# Patient Record
Sex: Female | Born: 2000
Health system: Southern US, Community
[De-identification: ages and names within clinical notes are randomized; demographics above are authoritative.]

## PROBLEM LIST (undated history)

## (undated) DIAGNOSIS — Z8669 Personal history of other diseases of the nervous system and sense organs: Secondary | ICD-10-CM

## (undated) HISTORY — PX: HERNIA REPAIR: SHX51

---
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
## 2020-05-25 ENCOUNTER — Encounter (HOSPITAL_BASED_OUTPATIENT_CLINIC_OR_DEPARTMENT_OTHER): Payer: Self-pay | Admitting: *Deleted

## 2020-05-25 ENCOUNTER — Emergency Department (INDEPENDENT_AMBULATORY_CARE_PROVIDER_SITE_OTHER)
Admission: EM | Admit: 2020-05-25 | Discharge: 2020-05-25 | Disposition: A | Discharge status: Home / Self Care | Payer: 59 | Source: Home / Self Care

## 2020-05-25 ENCOUNTER — Emergency Department (HOSPITAL_BASED_OUTPATIENT_CLINIC_OR_DEPARTMENT_OTHER): Payer: 59

## 2020-05-25 ENCOUNTER — Other Ambulatory Visit: Payer: Self-pay

## 2020-05-25 ENCOUNTER — Emergency Department (HOSPITAL_BASED_OUTPATIENT_CLINIC_OR_DEPARTMENT_OTHER)
Admission: EM | Admit: 2020-05-25 | Discharge: 2020-05-25 | Disposition: A | Discharge status: Home / Self Care | Payer: 59 | Attending: Emergency Medicine | Admitting: Emergency Medicine

## 2020-05-25 ENCOUNTER — Encounter: Payer: Self-pay | Admitting: Emergency Medicine

## 2020-05-25 DIAGNOSIS — R1013 Epigastric pain: Secondary | ICD-10-CM | POA: Diagnosis not present

## 2020-05-25 DIAGNOSIS — R630 Anorexia: Secondary | ICD-10-CM

## 2020-05-25 DIAGNOSIS — R112 Nausea with vomiting, unspecified: Secondary | ICD-10-CM | POA: Diagnosis not present

## 2020-05-25 DIAGNOSIS — F17290 Nicotine dependence, other tobacco product, uncomplicated: Secondary | ICD-10-CM | POA: Diagnosis not present

## 2020-05-25 DIAGNOSIS — R1010 Upper abdominal pain, unspecified: Secondary | ICD-10-CM | POA: Insufficient documentation

## 2020-05-25 DIAGNOSIS — R1084 Generalized abdominal pain: Secondary | ICD-10-CM | POA: Diagnosis not present

## 2020-05-25 DIAGNOSIS — R110 Nausea: Secondary | ICD-10-CM

## 2020-05-25 HISTORY — DX: Personal history of other diseases of the nervous system and sense organs: Z86.69

## 2020-05-25 LAB — COMPREHENSIVE METABOLIC PANEL
ALT: 14 U/L (ref 0–44)
AST: 14 U/L — ABNORMAL LOW (ref 15–41)
Albumin: 4.1 g/dL (ref 3.5–5.0)
Alkaline Phosphatase: 50 U/L (ref 38–126)
Anion gap: 9 (ref 5–15)
BUN: 11 mg/dL (ref 6–20)
CO2: 22 mmol/L (ref 22–32)
Calcium: 8.7 mg/dL — ABNORMAL LOW (ref 8.9–10.3)
Chloride: 106 mmol/L (ref 98–111)
Creatinine, Ser: 0.54 mg/dL (ref 0.44–1.00)
GFR, Estimated: >60 mL/min (ref >60)
Glucose, Bld: 85 mg/dL (ref 70–99)
Potassium: 3.8 mmol/L (ref 3.5–5.1)
Sodium: 137 mmol/L (ref 135–145)
Total Bilirubin: 0.7 mg/dL (ref 0.3–1.2)
Total Protein: 7 g/dL (ref 6.5–8.1)

## 2020-05-25 LAB — CBC WITH DIFFERENTIAL/PLATELET
Abs Immature Granulocytes: 0.03 10*3/uL (ref 0.00–0.07)
Basophils Absolute: 0 10*3/uL (ref 0.0–0.1)
Basophils Relative: 1 %
Eosinophils Absolute: 0.2 10*3/uL (ref 0.0–0.5)
Eosinophils Relative: 3 %
HCT: 39.4 % (ref 36.0–46.0)
Hemoglobin: 13.2 g/dL (ref 12.0–15.0)
Immature Granulocytes: 0 %
Lymphocytes Relative: 23 %
Lymphs Abs: 1.9 10*3/uL (ref 0.7–4.0)
MCH: 29.9 pg (ref 26.0–34.0)
MCHC: 33.5 g/dL (ref 30.0–36.0)
MCV: 89.1 fL (ref 80.0–100.0)
Monocytes Absolute: 0.6 10*3/uL (ref 0.1–1.0)
Monocytes Relative: 8 %
Neutro Abs: 5.3 10*3/uL (ref 1.7–7.7)
Neutrophils Relative %: 65 %
Platelets: 294 10*3/uL (ref 150–400)
RBC: 4.42 MIL/uL (ref 3.87–5.11)
RDW: 11.9 % (ref 11.5–15.5)
WBC: 8.1 10*3/uL (ref 4.0–10.5)
nRBC: 0 % (ref 0.0–0.2)

## 2020-05-25 LAB — POCT URINALYSIS DIP (MANUAL ENTRY)
Bilirubin, UA: NEGATIVE
Blood, UA: NEGATIVE
Glucose, UA: NEGATIVE mg/dL
Ketones, POC UA: NEGATIVE mg/dL
Nitrite, UA: NEGATIVE
Protein Ur, POC: NEGATIVE mg/dL
Spec Grav, UA: 1.025 (ref 1.010–1.025)
Urobilinogen, UA: 0.2 E.U./dL
pH, UA: 7.5 (ref 5.0–8.0)

## 2020-05-25 LAB — LIPASE, BLOOD: Lipase: 35 U/L (ref 11–51)

## 2020-05-25 LAB — POCT URINE PREGNANCY: Preg Test, Ur: NEGATIVE

## 2020-05-25 MED ORDER — MORPHINE SULFATE (PF) 4 MG/ML IV SOLN
4.0000 mg | Freq: Once | INTRAVENOUS | Status: AC
  Administered 2020-05-25: 4 mg via INTRAVENOUS
  Filled 2020-05-25: qty 1

## 2020-05-25 MED ORDER — FLUTICASONE PROPIONATE 50 MCG/ACT NA SUSP: 2.0000 | Freq: Every day | NASAL | 2 refills | Status: DC

## 2020-05-25 MED ORDER — PANTOPRAZOLE SODIUM 20 MG PO TBEC: 20.0000 mg | DELAYED_RELEASE_TABLET | Freq: Every day | ORAL | 0 refills | Status: AC

## 2020-05-25 MED ORDER — DICYCLOMINE HCL 20 MG PO TABS
20.0000 mg | ORAL_TABLET | Freq: Two times a day (BID) | ORAL | 0 refills | Status: AC | PRN
Start: 2020-05-25 — End: ?

## 2020-05-25 MED ORDER — ONDANSETRON HCL 4 MG/2ML IJ SOLN
4.0000 mg | Freq: Once | INTRAMUSCULAR | Status: AC
  Administered 2020-05-25: 4 mg via INTRAVENOUS
  Filled 2020-05-25: qty 2

## 2020-05-25 MED ORDER — ONDANSETRON 4 MG PO TBDP: 4.0000 mg | ORAL_TABLET | Freq: Three times a day (TID) | ORAL | 0 refills | Status: AC | PRN

## 2020-05-25 MED ORDER — AMOXICILLIN-POT CLAVULANATE 875-125 MG PO TABS: 1.0000 | ORAL_TABLET | Freq: Two times a day (BID) | ORAL | 0 refills | Status: DC

## 2020-05-25 MED ORDER — SODIUM CHLORIDE 0.9 % IV BOLUS
1000.0000 mL | Freq: Once | INTRAVENOUS | Status: AC
  Administered 2020-05-25: 1000 mL via INTRAVENOUS

## 2020-05-25 NOTE — ED Notes (Signed)
PT IS NPO UNTIL FURTHER NOTICE

## 2020-05-25 NOTE — Discharge Instructions (Addendum)
Go to the ER for further evaluation and treatment  Cannot rule out gallbladder or pancreas causes of your pain in this office

## 2020-05-25 NOTE — ED Triage Notes (Signed)
Presents with abd pain, onset this past Friday am. Having nausea as well. Has poor appetite, able to keep POs down, tolerates fluids more than solids. Denies fevers.

## 2020-05-25 NOTE — ED Notes (Signed)
Patient is being discharged from the Urgent Care and sent to the Emergency Department via POV w/ s/o  . Per Moshe Cipro, NP, patient is in need of higher level of care due to testing needs and abdominal tenderness. Patient is aware and verbalizes understanding of plan of care.  Vitals:   05/25/20 1236  BP: 103/66  Pulse: 83  Resp: 17  Temp: 98.5 F (36.9 C)  SpO2: 99%

## 2020-05-25 NOTE — ED Notes (Signed)
Pt tolerating po without nausea or vomiting, feels as if she is ready for discharge.

## 2020-05-25 NOTE — Discharge Instructions (Signed)
Take Protonix daily for the next 2 weeks to decrease stomach acid. You Zofran as needed for nausea or vomiting. Use Bentyl as needed for abdominal pain or cramping. You may also use Tylenol or ibuprofen as needed for pain. Eat a bland diet until symptoms completely resolve.  Avoid spicy, greasy, acidic foods. Eat small amounts as opposed to big meals. Remain upright for at least 60 minutes after eating to decrease symptoms. If your symptoms persist, follow-up with your GI doctor listed below. Return to the emergency room if you develop fevers, persistent vomiting despite medication, severe worsening pain, or any new, worsening, concerning symptoms

## 2020-05-25 NOTE — ED Provider Notes (Signed)
[facility] EMERGENCY DEPARTMENT Provider Note   CSN: [id number] Arrival date & time: 05/25/20  1329     History Chief Complaint  Patient presents with  . Abdominal Pain    Deborah Norton is a 20 y.o. female presenting for evaluation of abdominal pain, nausea, vomiting.  Patient states symptoms began 3 days ago. She has had worsening nausea, vomiting, abdominal pain. Symptoms are worse after p.o. and when she first wakes up in the morning. Initially she thought it was a stomach bug, but due to persistent symptoms, she was evaluated at urgent care. Recommend she come to the ER for further evaluation. She denies fevers, chills, chest pain, shortness breath, cough, urinary symptoms, normal bowel movements. No change in pain with urination or bowel movements. She has not taken anything for her symptoms including Tylenol or ibuprofen. No one else around her is sick. No previous history of stomach problems. She denies tobacco use. Reports intermittent alcohol use, none a few days ago. She uses marijuana, but no other drugs. No increase in marijuana use recently. Last period was end of last month, was normal for her. No history of problems with her gallbladder or pancreas.  Additional history obtained from chart review. Reviewed urgent care notes in which patient had a negative urine pregnancy and negative UA.  HPI     Past Medical History:  Diagnosis Date  . Hx of migraines     There are no problems to display for this patient.   History reviewed. No pertinent surgical history.   OB History   No obstetric history on file.     Family History  Problem Relation Age of Onset  . Asthma Mother   . Healthy Father   . Healthy Brother     Social History   Tobacco Use  . Smoking status: Current Every Day Smoker    Types: E-cigarettes  . Smokeless tobacco: Never Used  Vaping Use  . Vaping Use: Every day  . Substances: Nicotine, Flavoring  Substance Use Topics  .  Alcohol use: Yes    Alcohol/week: 1.0 standard drink    Types: 1 Standard drinks or equivalent per week  . Drug use: Yes    Frequency: 2.0 times per week    Types: Marijuana    Home Medications Prior to Admission medications   Medication Sig Start Date End Date Taking? Authorizing Provider  dicyclomine (BENTYL) 20 MG tablet Take 1 tablet (20 mg total) by mouth 2 (two) times daily as needed for spasms. 05/25/20  Yes Farzad Tibbetts, PA-C  ondansetron (ZOFRAN ODT) 4 MG disintegrating tablet Take 1 tablet (4 mg total) by mouth every 8 (eight) hours as needed for nausea or vomiting. 05/25/20  Yes Johnwesley Lederman, PA-C  pantoprazole (PROTONIX) 20 MG tablet Take 1 tablet (20 mg total) by mouth daily. 05/25/20  Yes Alexie Samson, PA-C  dextromethorphan-guaiFENesin (MUCINEX DM) 30-600 MG 12hr tablet Take 1 tablet 2 (two) times daily as needed by mouth for cough. Patient not taking: Reported on 05/25/2020    [provider]  ibuprofen (ADVIL,MOTRIN) 200 MG tablet Take 200 mg every 6 (six) hours as needed by mouth for moderate pain. Patient not taking: Reported on 05/25/2020    [provider]  levonorgestrel-ethinyl estradiol (ALTAVERA) 0.15-30 MG-MCG tablet Take 1 tablet by mouth daily.    [provider]  fluticasone (FLONASE) 50 MCG/ACT nasal spray Place 2 sprays into both nostrils daily. 05/25/20 05/25/20  Moshe Cipro, NP    Allergies  Latex  Review of Systems   Review of Systems  Gastrointestinal: Positive for abdominal pain, nausea and vomiting.  All other systems reviewed and are negative.   Physical Exam Updated Vital Signs BP 104/60 (BP Location: Right Arm)   Pulse 69   Temp 98 F (36.7 C) (Oral)   Resp 16   Ht 5\' 5"  (1.651 m)   Wt 77.1 kg   LMP 05/13/2020 (Exact Date)   SpO2 100%   BMI 28.29 kg/m   Physical Exam Vitals and nursing note reviewed.  Constitutional:      General: She is not in acute distress.    Appearance: She is  well-developed and well-nourished.     Comments: Resting in the bed in NAD  HENT:     Head: Normocephalic and atraumatic.  Eyes:     Extraocular Movements: EOM normal.     Conjunctiva/sclera: Conjunctivae normal.     Pupils: Pupils are equal, round, and reactive to light.  Cardiovascular:     Rate and Rhythm: Normal rate and regular rhythm.     Pulses: Normal pulses and intact distal pulses.  Pulmonary:     Effort: Pulmonary effort is normal. No respiratory distress.     Breath sounds: Normal breath sounds. No wheezing.  Abdominal:     General: There is no distension.     Palpations: Abdomen is soft. There is no mass.     Tenderness: There is abdominal tenderness. There is no guarding or rebound.     Comments: TTP of RUQ, epigastric and LUQ abd. No rigidity or distention. Mild voluntary guarding. No CVA tenderness.   Musculoskeletal:        General: Normal range of motion.     Cervical back: Normal range of motion and neck supple.  Skin:    General: Skin is warm and dry.     Capillary Refill: Capillary refill takes less than 2 seconds.  Neurological:     Mental Status: She is alert and oriented to person, place, and time.  Psychiatric:        Mood and Affect: Mood and affect normal.     ED Results / Procedures / Treatments   Labs (all labs ordered are listed, but only abnormal results are displayed) Labs Reviewed  COMPREHENSIVE METABOLIC PANEL - Abnormal; Notable for the following components:      Result Value   Calcium 8.7 (*)    AST 14 (*)    All other components within normal limits  CBC WITH DIFFERENTIAL/PLATELET  LIPASE, BLOOD    EKG None  Radiology 05/15/2020 Abdomen Limited RUQ (LIVER/GB)  Result Date: 05/25/2020 CLINICAL DATA:  Postprandial pain for 3 days. EXAM: ULTRASOUND ABDOMEN LIMITED RIGHT UPPER QUADRANT COMPARISON:  None. FINDINGS: Gallbladder: No gallstones or wall thickening visualized. No sonographic Murphy sign noted by sonographer. Common bile duct:  Diameter: Normal, 3 mm. Liver: No focal lesion identified. Within normal limits in parenchymal echogenicity. Portal vein is patent on color Doppler imaging with normal direction of blood flow towards the liver. Other: None. IMPRESSION: Normal right upper quadrant ultrasound. No explanation for patient's symptoms. Electronically Signed   By: 07/25/2020 M.D.   On: 05/25/2020 15:05    Procedures Procedures   Medications Ordered in ED Medications  morphine 4 MG/ML injection 4 mg (4 mg Intravenous Given 05/25/20 1441)  ondansetron (ZOFRAN) injection 4 mg (4 mg Intravenous Given 05/25/20 1441)  sodium chloride 0.9 % bolus 1,000 mL (0 mLs Intravenous Stopped 05/25/20 1600)  ED Course  I have reviewed the triage vital signs and the nursing notes.  Pertinent labs & imaging results that were available during my care of the patient were reviewed by me and considered in my medical decision making (see chart for details).    MDM Rules/Calculators/A&P                          Patient presented for evaluation of nausea, vomiting, abdominal pain. On exam, patient peers nontoxic. She does have tenderness palpation of the upper abdomen. Consider gallbladder etiology, pancreatitis, gastritis. Less likely appendicitis, as the patient has no pain in her lower abdomen. Less likely urinary source. Less likely viral GI illness, as patient is without diarrhea. Will treat symptomatically, obtain labs.  Labs interpreted by me, overall reassuring. Lipase is normal. As such, obtain ultrasound to rule out gallstones.  Ultrasound negative for acute findings. Pt reports sxs are much improved. Will po challenge and plan for d/c with symptomatic tx.   Patient tolerated p.o. without difficulty.  Discussed with patient possibility of gastritis versus GERD versus PUD.  Discussed continued symptomatic management as well as use of the PPI.  Encourage follow-up with GI symptoms not proving.  At this time, patient appears safe for  discharge.  Return precautions given.  Patient states she understands and agrees to plan.  Final Clinical Impression(s) / ED Diagnoses Final diagnoses:  Upper abdominal pain  Non-intractable vomiting with nausea, unspecified vomiting type    Rx / DC Orders ED Discharge Orders         Ordered    pantoprazole (PROTONIX) 20 MG tablet  Daily        05/25/20 1731    ondansetron (ZOFRAN ODT) 4 MG disintegrating tablet  Every 8 hours PRN        05/25/20 1731    dicyclomine (BENTYL) 20 MG tablet  2 times daily PRN        05/25/20 1731           Beulah Matusek, PA-C 05/25/20 1737    Benjiman Core, MD 05/26/20 1845

## 2020-05-25 NOTE — ED Notes (Signed)
Provided po food/fluids for po challenge

## 2020-05-25 NOTE — ED Provider Notes (Addendum)
[facility]    CSN: [id number] Arrival date & time: 05/25/20  1212      History   Chief Complaint Chief Complaint  Patient presents with  . Abdominal Pain    HPI Deborah Norton is a 20 y.o. female.   Reports upper abdominal pain x 2 days. Reports pain is worse with eating. Has not had this issue before. Reports that the pain is worse in the morning. Reports that she is also experiencing nausea and decreased appetite. Has not attempted OTC treatment.  ROS per HPI  The history is provided by the patient.  Abdominal Pain Pain location:  Epigastric Pain radiates to:  Back Pain severity:  Moderate Onset quality:  Sudden Duration:  2 days Timing:  Constant Progression:  Worsening Chronicity:  New Context: awakening from sleep and eating   Context: not alcohol use and not diet changes   Relieved by:  Nothing Worsened by:  Eating, palpation and position changes Ineffective treatments:  None tried   Past Medical History:  Diagnosis Date  . Hx of migraines     There are no problems to display for this patient.     OB History   No obstetric history on file.      Home Medications    Prior to Admission medications   Medication Sig Start Date End Date Taking? Authorizing Provider  levonorgestrel-ethinyl estradiol (ALTAVERA) 0.15-30 MG-MCG tablet Take 1 tablet by mouth daily.   Yes [provider]  fluticasone (FLONASE) 50 MCG/ACT nasal spray Place 2 sprays into both nostrils daily. 05/25/20 05/25/20 Yes Moshe Cipro, NP  dextromethorphan-guaiFENesin [facility] DM) 30-600 MG 12hr tablet Take 1 tablet 2 (two) times daily as needed by mouth for cough. Patient not taking: Reported on 05/25/2020    [provider]  ibuprofen (ADVIL,MOTRIN) 200 MG tablet Take 200 mg every 6 (six) hours as needed by mouth for moderate pain. Patient not taking: Reported on 05/25/2020    [provider]    Family History Family History  Problem  Relation Age of Onset  . Asthma Mother   . Healthy Father   . Healthy Brother     Social History Social History   Tobacco Use  . Smoking status: Current Every Day Smoker    Types: E-cigarettes  . Smokeless tobacco: Never Used  Vaping Use  . Vaping Use: Every day  . Substances: Nicotine, Flavoring  Substance Use Topics  . Alcohol use: Yes    Alcohol/week: 1.0 standard drink    Types: 1 Standard drinks or equivalent per week  . Drug use: Yes    Frequency: 2.0 times per week    Types: Marijuana     Allergies   Latex   Review of Systems Review of Systems  Gastrointestinal: Positive for abdominal pain.     Physical Exam Triage Vital Signs ED Triage Vitals  Enc Vitals Group     BP 05/25/20 1236 103/66     Pulse Rate 05/25/20 1236 83     Resp 05/25/20 1236 17     Temp 05/25/20 1236 98.5 F (36.9 C)     Temp Source 05/25/20 1236 Oral     SpO2 05/25/20 1236 99 %     Weight 05/25/20 1237 170 lb (77.1 kg)     Height 05/25/20 1237 5\' 5"  (1.651 m)     Head Circumference --      Peak Flow --      Pain Score 05/25/20 1236 2  Pain Loc --      Pain Edu? --      Excl. in GC? --    No data found.  Updated Vital Signs BP 103/66 (BP Location: Right Arm)   Pulse 83   Temp 98.5 F (36.9 C) (Oral)   Resp 17   Ht 5\' 5"  (1.651 m)   Wt 170 lb (77.1 kg)   LMP 05/13/2020 (Exact Date)   SpO2 99%   BMI 28.29 kg/m      Physical Exam Vitals and nursing note reviewed.  Constitutional:      General: She is not in acute distress.    Appearance: Normal appearance. She is well-developed and well-nourished. She is not ill-appearing.  HENT:     Head: Normocephalic and atraumatic.     Mouth/Throat:     Mouth: Mucous membranes are moist.     Pharynx: Oropharynx is clear.  Eyes:     Extraocular Movements: Extraocular movements intact.     Conjunctiva/sclera: Conjunctivae normal.  Cardiovascular:     Rate and Rhythm: Normal rate and regular rhythm.     Heart sounds:  Normal heart sounds. No murmur heard.   Pulmonary:     Effort: Pulmonary effort is normal. No respiratory distress.     Breath sounds: Normal breath sounds. No stridor. No wheezing, rhonchi or rales.  Chest:     Chest wall: No tenderness.  Abdominal:     General: Abdomen is flat. Bowel sounds are normal. There is no distension or abdominal bruit.     Palpations: Abdomen is soft. There is no shifting dullness, fluid wave, hepatomegaly, splenomegaly, mass or pulsatile mass.     Tenderness: There is abdominal tenderness in the right upper quadrant, epigastric area, left upper quadrant and left lower quadrant. There is guarding. Positive signs include Murphy's sign and psoas sign. Negative signs include McBurney's sign and obturator sign.     Hernia: No hernia is present.  Musculoskeletal:        General: No edema.     Cervical back: Neck supple.  Skin:    General: Skin is warm and dry.     Capillary Refill: Capillary refill takes less than 2 seconds.  Neurological:     General: No focal deficit present.     Mental Status: She is alert and oriented to person, place, and time.  Psychiatric:        Mood and Affect: Mood and affect and mood normal.        Behavior: Behavior normal.        Thought Content: Thought content normal.      UC Treatments / Results  Labs (all labs ordered are listed, but only abnormal results are displayed) Labs Reviewed  POCT URINALYSIS DIP (MANUAL ENTRY) - Abnormal; Notable for the following components:      Result Value   Clarity, UA cloudy (*)    Leukocytes, UA Trace (*)    All other components within normal limits  POCT URINE PREGNANCY    EKG   Radiology No results found.  Procedures Procedures (including critical care time)  Medications Ordered in UC Medications - No data to display  Initial Impression / Assessment and Plan / UC Course  I have reviewed the triage vital signs and the nursing notes.  Pertinent labs & imaging results that  were available during my care of the patient were reviewed by me and considered in my medical decision making (see chart for details).    Abdominal Pain  Nausea Decreased Appetite  Given the location and severity of pain, discussed that patient would be best served in the ER for further evaluation and treatment Concern for pancreatitis vs cholecystitis Patient verbalizes agreement To ER via POV    Final Clinical Impressions(s) / UC Diagnoses   Final diagnoses:  Generalized abdominal pain  Nausea without vomiting  Decreased appetite     Discharge Instructions     Go to the ER for further evaluation and treatment  Cannot rule out gallbladder or pancreas causes of your pain in this office    ED Prescriptions    Medication Sig Dispense Auth. Provider   amoxicillin-clavulanate (AUGMENTIN) 875-125 MG tablet  (Status: Discontinued) Take 1 tablet by mouth 2 (two) times daily for 7 days. 14 tablet Moshe Cipro, NP   fluticasone (FLONASE) 50 MCG/ACT nasal spray  (Status: Discontinued) Place 2 sprays into both nostrils daily. 9.9 mL Moshe Cipro, NP     PDMP not reviewed this encounter.   Moshe Cipro, NP 05/25/20 1243    Moshe Cipro, NP 05/25/20 1310

## 2020-05-25 NOTE — ED Triage Notes (Signed)
Upper abdominal pain x 2 days Pain worse after eating No OTC meds Pt has not eaten today  Denies pregnancy  Denies dysuria  No COVID vaccine

## 2020-06-18 ENCOUNTER — Other Ambulatory Visit (HOSPITAL_COMMUNITY): Payer: Self-pay | Admitting: *Deleted

## 2020-06-18 DIAGNOSIS — R3915 Urgency of urination: Secondary | ICD-10-CM | POA: Diagnosis not present

## 2020-06-18 DIAGNOSIS — R300 Dysuria: Secondary | ICD-10-CM | POA: Diagnosis not present

## 2020-06-18 MED FILL — CIPROFLOXACIN HCL 500 MG TA: 500 | 10 days supply | Qty: 20 | Fill #0

## 2020-07-10 ENCOUNTER — Other Ambulatory Visit (HOSPITAL_BASED_OUTPATIENT_CLINIC_OR_DEPARTMENT_OTHER): Payer: Self-pay

## 2020-08-06 ENCOUNTER — Other Ambulatory Visit (HOSPITAL_BASED_OUTPATIENT_CLINIC_OR_DEPARTMENT_OTHER): Payer: Self-pay

## 2020-08-06 DIAGNOSIS — Z113 Encounter for screening for infections with a predominantly sexual mode of transmission: Secondary | ICD-10-CM | POA: Diagnosis not present

## 2020-08-06 DIAGNOSIS — N3000 Acute cystitis without hematuria: Secondary | ICD-10-CM | POA: Diagnosis not present

## 2020-08-06 MED ORDER — DOXYCYCLINE HYCLATE 100 MG PO CAPS
ORAL_CAPSULE | ORAL | 0 refills | Status: DC
  Filled 2020-08-06: qty 14, 7d supply, fill #0

## 2020-09-27 DIAGNOSIS — Z03818 Encounter for observation for suspected exposure to other biological agents ruled out: Secondary | ICD-10-CM | POA: Diagnosis not present

## 2020-09-27 DIAGNOSIS — J069 Acute upper respiratory infection, unspecified: Secondary | ICD-10-CM | POA: Diagnosis not present

## 2020-09-27 DIAGNOSIS — Z20822 Contact with and (suspected) exposure to covid-19: Secondary | ICD-10-CM | POA: Diagnosis not present

## 2020-11-01 DIAGNOSIS — R300 Dysuria: Secondary | ICD-10-CM | POA: Diagnosis not present

## 2020-11-01 DIAGNOSIS — N390 Urinary tract infection, site not specified: Secondary | ICD-10-CM | POA: Diagnosis not present

## 2020-12-17 DIAGNOSIS — R350 Frequency of micturition: Secondary | ICD-10-CM | POA: Diagnosis not present

## 2020-12-17 DIAGNOSIS — N390 Urinary tract infection, site not specified: Secondary | ICD-10-CM | POA: Diagnosis not present

## 2020-12-17 DIAGNOSIS — R351 Nocturia: Secondary | ICD-10-CM | POA: Diagnosis not present

## 2021-01-02 DIAGNOSIS — R350 Frequency of micturition: Secondary | ICD-10-CM | POA: Diagnosis not present

## 2021-01-02 DIAGNOSIS — N390 Urinary tract infection, site not specified: Secondary | ICD-10-CM | POA: Diagnosis not present

## 2021-01-02 DIAGNOSIS — R351 Nocturia: Secondary | ICD-10-CM | POA: Diagnosis not present

## 2021-01-13 DIAGNOSIS — R3915 Urgency of urination: Secondary | ICD-10-CM | POA: Diagnosis not present

## 2021-01-13 DIAGNOSIS — M6289 Other specified disorders of muscle: Secondary | ICD-10-CM | POA: Diagnosis not present

## 2021-01-13 DIAGNOSIS — M62838 Other muscle spasm: Secondary | ICD-10-CM | POA: Diagnosis not present

## 2021-01-13 DIAGNOSIS — N3946 Mixed incontinence: Secondary | ICD-10-CM | POA: Diagnosis not present

## 2021-01-21 DIAGNOSIS — M62838 Other muscle spasm: Secondary | ICD-10-CM | POA: Diagnosis not present

## 2021-01-21 DIAGNOSIS — N941 Unspecified dyspareunia: Secondary | ICD-10-CM | POA: Diagnosis not present

## 2021-01-21 DIAGNOSIS — R102 Pelvic and perineal pain: Secondary | ICD-10-CM | POA: Diagnosis not present

## 2021-01-21 DIAGNOSIS — N393 Stress incontinence (female) (male): Secondary | ICD-10-CM | POA: Diagnosis not present

## 2021-01-21 DIAGNOSIS — M6289 Other specified disorders of muscle: Secondary | ICD-10-CM | POA: Diagnosis not present

## 2021-01-21 DIAGNOSIS — M6281 Muscle weakness (generalized): Secondary | ICD-10-CM | POA: Diagnosis not present

## 2021-01-27 DIAGNOSIS — N393 Stress incontinence (female) (male): Secondary | ICD-10-CM | POA: Diagnosis not present

## 2021-01-27 DIAGNOSIS — M6281 Muscle weakness (generalized): Secondary | ICD-10-CM | POA: Diagnosis not present

## 2021-01-27 DIAGNOSIS — N941 Unspecified dyspareunia: Secondary | ICD-10-CM | POA: Diagnosis not present

## 2021-01-27 DIAGNOSIS — R102 Pelvic and perineal pain: Secondary | ICD-10-CM | POA: Diagnosis not present

## 2021-01-27 DIAGNOSIS — M62838 Other muscle spasm: Secondary | ICD-10-CM | POA: Diagnosis not present

## 2021-01-27 DIAGNOSIS — M6289 Other specified disorders of muscle: Secondary | ICD-10-CM | POA: Diagnosis not present

## 2021-02-10 DIAGNOSIS — M62838 Other muscle spasm: Secondary | ICD-10-CM | POA: Diagnosis not present

## 2021-02-10 DIAGNOSIS — M6281 Muscle weakness (generalized): Secondary | ICD-10-CM | POA: Diagnosis not present

## 2021-02-10 DIAGNOSIS — M6289 Other specified disorders of muscle: Secondary | ICD-10-CM | POA: Diagnosis not present

## 2021-02-10 DIAGNOSIS — N393 Stress incontinence (female) (male): Secondary | ICD-10-CM | POA: Diagnosis not present

## 2021-02-10 DIAGNOSIS — N941 Unspecified dyspareunia: Secondary | ICD-10-CM | POA: Diagnosis not present

## 2021-02-10 DIAGNOSIS — R102 Pelvic and perineal pain: Secondary | ICD-10-CM | POA: Diagnosis not present

## 2021-02-25 DIAGNOSIS — N393 Stress incontinence (female) (male): Secondary | ICD-10-CM | POA: Diagnosis not present

## 2021-02-25 DIAGNOSIS — R102 Pelvic and perineal pain: Secondary | ICD-10-CM | POA: Diagnosis not present

## 2021-02-25 DIAGNOSIS — M6281 Muscle weakness (generalized): Secondary | ICD-10-CM | POA: Diagnosis not present

## 2021-02-25 DIAGNOSIS — M6289 Other specified disorders of muscle: Secondary | ICD-10-CM | POA: Diagnosis not present

## 2021-02-25 DIAGNOSIS — M62838 Other muscle spasm: Secondary | ICD-10-CM | POA: Diagnosis not present

## 2021-02-25 DIAGNOSIS — N941 Unspecified dyspareunia: Secondary | ICD-10-CM | POA: Diagnosis not present

## 2021-03-01 ENCOUNTER — Emergency Department (HOSPITAL_BASED_OUTPATIENT_CLINIC_OR_DEPARTMENT_OTHER)
Admission: EM | Admit: 2021-03-01 | Discharge: 2021-03-01 | Disposition: A | Discharge status: Home / Self Care | Payer: 59 | Attending: Emergency Medicine | Admitting: Emergency Medicine

## 2021-03-01 ENCOUNTER — Encounter (HOSPITAL_BASED_OUTPATIENT_CLINIC_OR_DEPARTMENT_OTHER): Payer: Self-pay

## 2021-03-01 ENCOUNTER — Other Ambulatory Visit: Payer: Self-pay

## 2021-03-01 ENCOUNTER — Emergency Department (HOSPITAL_BASED_OUTPATIENT_CLINIC_OR_DEPARTMENT_OTHER): Payer: 59

## 2021-03-01 DIAGNOSIS — N200 Calculus of kidney: Secondary | ICD-10-CM | POA: Insufficient documentation

## 2021-03-01 DIAGNOSIS — Z91040 Latex allergy status: Secondary | ICD-10-CM | POA: Diagnosis not present

## 2021-03-01 DIAGNOSIS — R300 Dysuria: Secondary | ICD-10-CM

## 2021-03-01 DIAGNOSIS — F17290 Nicotine dependence, other tobacco product, uncomplicated: Secondary | ICD-10-CM | POA: Diagnosis not present

## 2021-03-01 LAB — URINALYSIS, MICROSCOPIC (REFLEX)

## 2021-03-01 LAB — PREGNANCY, URINE: Preg Test, Ur: NEGATIVE

## 2021-03-01 MED ORDER — KETOROLAC TROMETHAMINE 60 MG/2ML IM SOLN
60.0000 mg | Freq: Once | INTRAMUSCULAR | Status: AC
  Administered 2021-03-01: 60 mg via INTRAMUSCULAR
  Filled 2021-03-01: qty 2

## 2021-03-01 MED ORDER — OXYCODONE HCL 5 MG PO TABS: 5.0000 mg | ORAL_TABLET | Freq: Four times a day (QID) | ORAL | 0 refills | Status: DC | PRN

## 2021-03-01 MED ORDER — NITROFURANTOIN MONOHYD MACRO 100 MG PO CAPS: 100.0000 mg | ORAL_CAPSULE | Freq: Two times a day (BID) | ORAL | 0 refills | Status: AC

## 2021-03-01 MED ORDER — OXYCODONE HCL 5 MG PO TABS
10.0000 mg | ORAL_TABLET | Freq: Once | ORAL | Status: AC
  Administered 2021-03-01: 10 mg via ORAL
  Filled 2021-03-01: qty 2

## 2021-03-01 NOTE — ED Triage Notes (Signed)
Onset 2 days of left flank pain;  pain lower abdominal,  pain with urination.  Denies vomiting or nausea.  Hx of UTI.

## 2021-03-01 NOTE — ED Notes (Signed)
Patient transported to CT 

## 2021-03-01 NOTE — Discharge Instructions (Addendum)
Please call urologist office tomorrow to schedule follow-up appointment for your problems with urinating.  Your urine culture will take a few days to result.  We started you on an antibiotic today, although it is not clear if this is truly a urine infection or not.  Your doctor should follow-up on the culture results, and determine whether you continue to need antibiotics.

## 2021-03-01 NOTE — ED Notes (Signed)
Patient to restroom without difficulty.

## 2021-03-01 NOTE — ED Notes (Signed)
MD at bedside. 

## 2021-03-01 NOTE — ED Provider Notes (Signed)
[facility] EMERGENCY DEPT Provider Note   CSN: [id number] Arrival date & time: 03/01/21  [id number]     History Chief Complaint  Patient presents with   Flank Pain    Deborah Norton is a 20 y.o. female presenting to Emergency Department with dysuria and left-sided flank pain.  The patient reports onset of symptoms several days ago, with urinary pressure, that worsened in the past 24 hours.  She reports a stabbing sharp left-sided flank pain that is new from when she said before.  She suffers from chronic UTIs and dysuria is being evaluated for interstitial cystitis, and states that she will often get infections and pain, but this feels different to her and more significant.  She felt nauseated with this pain.  Pain is currently maximum.  Nothing makes it better or worse.  She did take Azo at home this morning to see if it would help with her dysuria.  She reports she has been urinating frank blood for the past 24 hours.  She does follow with urology for recurrent UTIs.  She has been on several antibiotics but reports adverse reactions to ciprofloxacin, specifically that it causes yeast infections.         Past Medical History:  Diagnosis Date   Hx of migraines     There are no problems to display for this patient.   Past Surgical History:  Procedure Laterality Date   HERNIA REPAIR       OB History   No obstetric history on file.     Family History  Problem Relation Age of Onset   Asthma Mother    Healthy Father    Healthy Brother     Social History   Tobacco Use   Smoking status: Every Day    Types: E-cigarettes   Smokeless tobacco: Never  Vaping Use   Vaping Use: Every day   Substances: Nicotine, Flavoring  Substance Use Topics   Alcohol use: Yes    Alcohol/week: 1.0 standard drink    Types: 1 Standard drinks or equivalent per week   Drug use: Yes    Frequency: 2.0 times per week    Types: Marijuana    Home Medications Prior to Admission  medications   Medication Sig Start Date End Date Taking? Authorizing Provider  nitrofurantoin, macrocrystal-monohydrate, (MACROBID) 100 MG capsule Take 1 capsule (100 mg total) by mouth 2 (two) times daily for 5 days. 03/01/21 03/06/21 Yes Izen Petz, Carola Rhine, MD  oxyCODONE (ROXICODONE) 5 MG immediate release tablet Take 1 tablet (5 mg total) by mouth every 6 (six) hours as needed for up to 12 doses for severe pain. 03/01/21  Yes Haven Foss, Carola Rhine, MD  ciprofloxacin (CIPRO) 500 MG tablet TAKE 1 TABLET (500 MG TOTAL) BY MOUTH IN THE MORNING AND 1 TABLET (500 MG TOTAL) IN THE EVENING FOR 10 DAYS. 06/18/20 06/18/21  Lynnae Prude, PA  dextromethorphan-guaiFENesin [facility] DM) 30-600 MG 12hr tablet Take 1 tablet 2 (two) times daily as needed by mouth for cough. Patient not taking: Reported on 05/25/2020    [provider]  dicyclomine (BENTYL) 20 MG tablet Take 1 tablet (20 mg total) by mouth 2 (two) times daily as needed for spasms. 05/25/20   Caccavale, Sophia, PA-C  doxycycline (VIBRAMYCIN) 100 MG capsule Take 1 capsule (100 mg total) by mouth in the morning and 1 capsule (100 mg total) in the evening. Do all this for 7 days. Take with at least 8 ounces (large glass) of water, do not  lie down for 30 minutes after. 08/06/20     ibuprofen (ADVIL,MOTRIN) 200 MG tablet Take 200 mg every 6 (six) hours as needed by mouth for moderate pain. Patient not taking: Reported on 05/25/2020    [provider]  levonorgestrel-ethinyl estradiol (ALTAVERA) 0.15-30 MG-MCG tablet Take 1 tablet by mouth daily.    [provider]  ondansetron (ZOFRAN ODT) 4 MG disintegrating tablet Take 1 tablet (4 mg total) by mouth every 8 (eight) hours as needed for nausea or vomiting. 05/25/20   Caccavale, Sophia, PA-C  pantoprazole (PROTONIX) 20 MG tablet Take 1 tablet (20 mg total) by mouth daily. 05/25/20   Caccavale, Sophia, PA-C  fluticasone (FLONASE) 50 MCG/ACT nasal spray Place 2 sprays into both nostrils daily. 05/25/20  05/25/20  Faustino Congress, NP    Allergies    Latex  Review of Systems   Review of Systems  Constitutional:  Negative for chills and fever.  Respiratory:  Negative for cough and shortness of breath.   Cardiovascular:  Negative for chest pain and palpitations.  Gastrointestinal:  Positive for nausea. Negative for abdominal pain.  Genitourinary:  Positive for difficulty urinating, dysuria, flank pain, frequency and hematuria.  Skin:  Negative for color change and rash.  Neurological:  Negative for seizures and syncope.  All other systems reviewed and are negative.  Physical Exam Updated Vital Signs BP (!) 98/47 (BP Location: Right Arm)   Pulse (!) 52   Temp 98.5 F (36.9 C) (Oral)   Resp 16   Ht 5\' 5"  (1.651 m)   Wt 72.6 kg   LMP 02/06/2021 (Exact Date)   SpO2 96%   BMI 26.63 kg/m   Physical Exam Constitutional:      General: She is not in acute distress. HENT:     Head: Normocephalic and atraumatic.  Eyes:     Conjunctiva/sclera: Conjunctivae normal.     Pupils: Pupils are equal, round, and reactive to light.  Cardiovascular:     Rate and Rhythm: Normal rate and regular rhythm.  Pulmonary:     Effort: Pulmonary effort is normal. No respiratory distress.  Abdominal:     General: There is no distension.     Tenderness: There is no abdominal tenderness.  Skin:    General: Skin is warm and dry.  Neurological:     General: No focal deficit present.     Mental Status: She is alert. Mental status is at baseline.  Psychiatric:        Mood and Affect: Mood normal.        Behavior: Behavior normal.    ED Results / Procedures / Treatments   Labs (all labs ordered are listed, but only abnormal results are displayed) Labs Reviewed  URINALYSIS, ROUTINE W REFLEX MICROSCOPIC - Abnormal; Notable for the following components:      Result Value   Color, Urine ORANGE (*)    APPearance TURBID (*)    Specific Gravity, Urine <1.005 (*)    Glucose, UA   (*)    Value: TEST  NOT REPORTED DUE TO COLOR INTERFERENCE OF URINE PIGMENT   Hgb urine dipstick   (*)    Value: TEST NOT REPORTED DUE TO COLOR INTERFERENCE OF URINE PIGMENT   Bilirubin Urine   (*)    Value: TEST NOT REPORTED DUE TO COLOR INTERFERENCE OF URINE PIGMENT   Ketones, ur   (*)    Value: TEST NOT REPORTED DUE TO COLOR INTERFERENCE OF URINE PIGMENT   Protein, ur   (*)  Value: TEST NOT REPORTED DUE TO COLOR INTERFERENCE OF URINE PIGMENT   Nitrite   (*)    Value: TEST NOT REPORTED DUE TO COLOR INTERFERENCE OF URINE PIGMENT   Leukocytes,Ua   (*)    Value: TEST NOT REPORTED DUE TO COLOR INTERFERENCE OF URINE PIGMENT   All other components within normal limits  URINALYSIS, MICROSCOPIC (REFLEX) - Abnormal; Notable for the following components:   Bacteria, UA FEW (*)    All other components within normal limits  URINE CULTURE  PREGNANCY, URINE    EKG None  Radiology CT Renal Stone Study  Result Date: 03/01/2021 CLINICAL DATA:  Flank pain.  Kidney stone suspected. EXAM: CT ABDOMEN AND PELVIS WITHOUT CONTRAST TECHNIQUE: Multidetector CT imaging of the abdomen and pelvis was performed following the standard protocol without IV contrast. COMPARISON:  US Abdomen, 01/02/2021 and 05/25/2020. FINDINGS: Lower chest: No acute abnormality. Hepatobiliary: No focal liver abnormality is seen. No gallstones, gallbladder wall thickening, or biliary dilatation. Pancreas: Unremarkable. No pancreatic ductal dilatation or surrounding inflammatory changes. Spleen: Normal in size without focal abnormality. Adrenals/Urinary Tract: Adrenal glands are unremarkable. RIGHT punctate (sub-3 mm) nonobstructing nephroliths are present. Kidneys are otherwise normal, without focal lesion, or hydronephrosis. Bladder is moderately distended. Punctate calcification at the LEFT pelvis is favored to represent a phlebolith. Stomach/Bowel: Stomach is within normal limits. Appendix appears normal. Nonobstructed small bowel. Nondilated colon. No  evidence of bowel wall thickening, distention, or inflammatory changes. Vascular/Lymphatic: No significant vascular findings are present. No enlarged abdominal or pelvic lymph nodes. Reproductive: Uterus and bilateral adnexa are unremarkable. Other: No abdominal wall hernia or abnormality. No abdominopelvic ascites. Musculoskeletal: No acute or significant osseous findings. IMPRESSION: 1. Punctate, nonobstructing RIGHT nephrolithiasis. 2. Additional punctate calcification within the LEFT pelvis is favored to represent a phlebolith. Electronically Signed   By: Michaelle Birks M.D.   On: 03/01/2021 11:31    Procedures Procedures   Medications Ordered in ED Medications  oxyCODONE (Oxy IR/ROXICODONE) immediate release tablet 10 mg (10 mg Oral Given 03/01/21 1104)  ketorolac (TORADOL) injection 60 mg (60 mg Intramuscular Given 03/01/21 1103)    ED Course  I have reviewed the triage vital signs and the nursing notes.  Pertinent labs & imaging results that were available during my care of the patient were reviewed by me and considered in my medical decision making (see chart for details).  Patient is here with suspected ureteral renal colic, history of kidney stones, we will obtain a CT renal stone study.  UA is limited by the patient's use of Azo, cannot evaluate for leukocytes or nitrites.  We will attempt to send a urine culture.  She does not appear to have signs of sepsis.  Vital signs are normal.  P.o. pain medications ordered.  CT renal study without evidence of acute ureteral stone.  Clinical Course as of 03/01/21 1512  Sun Mar 01, 2021  1340 Patient's pain is improved with the oxycodone.  I explained to her that is not clear that this is infection, this may just be her interstitial cystitis.  However her symptoms have improved with antibiotics in the past.  We will attempt to send a urine culture if possible, and I will start her with empiric treatment of Macrobid, and encouraged her to  follow-up with her urologist.  She verbalized understanding. [MT]    Clinical Course User Index [MT] Lucciano Vitali, Carola Rhine, MD    Final Clinical Impression(s) / ED Diagnoses Final diagnoses:  Dysuria    Rx /  DC Orders ED Discharge Orders          Ordered    oxyCODONE (ROXICODONE) 5 MG immediate release tablet  Every 6 hours PRN        03/01/21 1401    nitrofurantoin, macrocrystal-monohydrate, (MACROBID) 100 MG capsule  2 times daily        03/01/21 1401             Terald Sleeper, MD 03/01/21 1512

## 2021-03-02 LAB — URINALYSIS, ROUTINE W REFLEX MICROSCOPIC

## 2021-03-03 LAB — URINE CULTURE: Culture: 40000 — AB

## 2021-03-04 ENCOUNTER — Other Ambulatory Visit (HOSPITAL_BASED_OUTPATIENT_CLINIC_OR_DEPARTMENT_OTHER): Payer: Self-pay

## 2021-03-04 ENCOUNTER — Telehealth: Payer: Self-pay | Admitting: *Deleted

## 2021-03-04 DIAGNOSIS — R8271 Bacteriuria: Secondary | ICD-10-CM | POA: Diagnosis not present

## 2021-03-04 DIAGNOSIS — N3000 Acute cystitis without hematuria: Secondary | ICD-10-CM | POA: Diagnosis not present

## 2021-03-04 MED ORDER — URIBEL 118 MG PO CAPS
ORAL_CAPSULE | ORAL | 0 refills | Status: AC
Start: 2021-03-04 — End: ?
  Filled 2021-03-04: qty 20, 5d supply, fill #0

## 2021-03-04 NOTE — Telephone Encounter (Signed)
Post ED Visit - Positive Culture Follow-up  Culture report reviewed by antimicrobial stewardship pharmacist: [facility] []  Nathan Batchelder, Pharm.D. []  , Pharm.D., BCPS AQ-ID []  , Pharm.D., BCPS []  Celedonio Miyamoto, Pharm.D., BCPS []  Perry, Garvin Fila.D., BCPS, AAHIVP []  , Pharm.D., BCPS, AAHIVP []  Georgina Pillion, PharmD, BCPS []  , PharmD, BCPS []  [place], PharmD, BCPS []  [address], PharmD []  , PharmD, BCPS []  Estella Husk, PharmD  Pharmacy Team []  Lysle Pearl, PharmD []  , PharmD []  Phillips Climes, PharmD []  , Rph []  Agapito Games) , PharmD []  Verlan Friends, PharmD []  , PharmD []  Mervyn Gay, PharmD []  , PharmD []  Vinnie Level, PharmD []  [facility], PharmD []  , PharmD []  Len Childs, PharmD   Positive urine culture Treated with Nitrofurantoin Monohyd Macro, organism sensitive to the same and no further patient follow-up is required at this time.  , PharmD  Greer Pickerel Talley 03/04/2021, 8:24 AM

## 2021-03-05 ENCOUNTER — Other Ambulatory Visit (HOSPITAL_BASED_OUTPATIENT_CLINIC_OR_DEPARTMENT_OTHER): Payer: Self-pay

## 2021-03-13 ENCOUNTER — Other Ambulatory Visit (HOSPITAL_BASED_OUTPATIENT_CLINIC_OR_DEPARTMENT_OTHER): Payer: Self-pay

## 2021-05-11 ENCOUNTER — Encounter (HOSPITAL_BASED_OUTPATIENT_CLINIC_OR_DEPARTMENT_OTHER): Payer: Self-pay | Admitting: Urology

## 2021-05-11 ENCOUNTER — Other Ambulatory Visit: Payer: Self-pay

## 2021-05-11 ENCOUNTER — Inpatient Hospital Stay (HOSPITAL_BASED_OUTPATIENT_CLINIC_OR_DEPARTMENT_OTHER)
Admission: AD | Admit: 2021-05-11 | Discharge: 2021-05-11 | Disposition: A | Discharge status: Home / Self Care | Payer: BC Managed Care – PPO | Attending: Obstetrics and Gynecology | Admitting: Obstetrics and Gynecology

## 2021-05-11 ENCOUNTER — Inpatient Hospital Stay (HOSPITAL_COMMUNITY): Payer: BC Managed Care – PPO

## 2021-05-11 DIAGNOSIS — R55 Syncope and collapse: Secondary | ICD-10-CM

## 2021-05-11 DIAGNOSIS — Z3A01 Less than 8 weeks gestation of pregnancy: Secondary | ICD-10-CM | POA: Diagnosis not present

## 2021-05-11 DIAGNOSIS — O26891 Other specified pregnancy related conditions, first trimester: Secondary | ICD-10-CM | POA: Diagnosis not present

## 2021-05-11 DIAGNOSIS — R109 Unspecified abdominal pain: Secondary | ICD-10-CM

## 2021-05-11 DIAGNOSIS — O3680X0 Pregnancy with inconclusive fetal viability, not applicable or unspecified: Secondary | ICD-10-CM | POA: Diagnosis present

## 2021-05-11 DIAGNOSIS — Z3201 Encounter for pregnancy test, result positive: Secondary | ICD-10-CM | POA: Diagnosis not present

## 2021-05-11 DIAGNOSIS — Z3491 Encounter for supervision of normal pregnancy, unspecified, first trimester: Secondary | ICD-10-CM

## 2021-05-11 LAB — URINALYSIS, ROUTINE W REFLEX MICROSCOPIC
Bilirubin Urine: NEGATIVE
Glucose, UA: NEGATIVE mg/dL
Hgb urine dipstick: NEGATIVE
Ketones, ur: NEGATIVE mg/dL
Leukocytes,Ua: NEGATIVE
Nitrite: NEGATIVE
Protein, ur: NEGATIVE mg/dL
Specific Gravity, Urine: >=1.03 (ref 1.005–1.030)
pH: 6 (ref 5.0–8.0)

## 2021-05-11 LAB — CBC WITH DIFFERENTIAL/PLATELET
Abs Immature Granulocytes: 0.02 10*3/uL (ref 0.00–0.07)
Basophils Absolute: 0 10*3/uL (ref 0.0–0.1)
Basophils Relative: 0 %
Eosinophils Absolute: 0.1 10*3/uL (ref 0.0–0.5)
Eosinophils Relative: 1 %
HCT: 34.6 % — ABNORMAL LOW (ref 36.0–46.0)
Hemoglobin: 11.7 g/dL — ABNORMAL LOW (ref 12.0–15.0)
Immature Granulocytes: 0 %
Lymphocytes Relative: 24 %
Lymphs Abs: 2 10*3/uL (ref 0.7–4.0)
MCH: 30.5 pg (ref 26.0–34.0)
MCHC: 33.8 g/dL (ref 30.0–36.0)
MCV: 90.3 fL (ref 80.0–100.0)
Monocytes Absolute: 0.6 10*3/uL (ref 0.1–1.0)
Monocytes Relative: 7 %
Neutro Abs: 5.5 10*3/uL (ref 1.7–7.7)
Neutrophils Relative %: 68 %
Platelets: 314 10*3/uL (ref 150–400)
RBC: 3.83 MIL/uL — ABNORMAL LOW (ref 3.87–5.11)
RDW: 11.9 % (ref 11.5–15.5)
WBC: 8.2 10*3/uL (ref 4.0–10.5)
nRBC: 0 % (ref 0.0–0.2)

## 2021-05-11 LAB — BASIC METABOLIC PANEL
Anion gap: 6 (ref 5–15)
BUN: 13 mg/dL (ref 6–20)
CO2: 22 mmol/L (ref 22–32)
Calcium: 8.5 mg/dL — ABNORMAL LOW (ref 8.9–10.3)
Chloride: 107 mmol/L (ref 98–111)
Creatinine, Ser: 0.56 mg/dL (ref 0.44–1.00)
GFR, Estimated: >60 mL/min (ref >60)
Glucose, Bld: 91 mg/dL (ref 70–99)
Potassium: 3.6 mmol/L (ref 3.5–5.1)
Sodium: 135 mmol/L (ref 135–145)

## 2021-05-11 LAB — PREGNANCY, URINE: Preg Test, Ur: POSITIVE — AB

## 2021-05-11 LAB — HCG, QUANTITATIVE, PREGNANCY: hCG, Beta Chain, Quant, S: 22402 m[IU]/mL — ABNORMAL HIGH (ref <5)

## 2021-05-11 NOTE — ED Notes (Signed)
Called MAU.  Report given to Morrie Sheldon, [place].  All questions answered

## 2021-05-11 NOTE — ED Notes (Signed)
Patient made aware of plan of care.  Instructed to go to [facility].  All questions answered.  Patient aware that she needs to remain NPO until being seen.  Understanding voiced

## 2021-05-11 NOTE — ED Provider Notes (Signed)
[facility] EMERGENCY DEPARTMENT Provider Note   CSN: [id number] Arrival date & time: 05/11/21  0147     History  Chief Complaint  Patient presents with   Possible Pregnancy    Deborah Norton is a 21 y.o. female.  The history is provided by the patient.  Possible Pregnancy This is a new problem. The current episode started more than 1 week ago. The problem occurs constantly. The problem has not changed since onset.Pertinent negatives include no chest pain, no headaches and no shortness of breath. Associated symptoms comments: Cramping . Nothing aggravates the symptoms. Nothing relieves the symptoms. She has tried nothing for the symptoms. The treatment provided no relief.  Patient is G1P0 with LMP 03/24/21 who presents with abdominal cramping and syncope x 2, most recently at 4 pm.  She has not seen OB.      Home Medications Prior to Admission medications   Medication Sig Start Date End Date Taking? Authorizing Provider  ciprofloxacin (CIPRO) 500 MG tablet TAKE 1 TABLET (500 MG TOTAL) BY MOUTH IN THE MORNING AND 1 TABLET (500 MG TOTAL) IN THE EVENING FOR 10 DAYS. 06/18/20 06/18/21  Princella Pellegrini, PA  dextromethorphan-guaiFENesin [facility] DM) 30-600 MG 12hr tablet Take 1 tablet 2 (two) times daily as needed by mouth for cough. Patient not taking: Reported on 05/25/2020    [provider]  dicyclomine (BENTYL) 20 MG tablet Take 1 tablet (20 mg total) by mouth 2 (two) times daily as needed for spasms. 05/25/20   Caccavale, Sophia, PA-C  doxycycline (VIBRAMYCIN) 100 MG capsule Take 1 capsule (100 mg total) by mouth in the morning and 1 capsule (100 mg total) in the evening. Do all this for 7 days. Take with at least 8 ounces (large glass) of water, do not lie down for 30 minutes after. 08/06/20     ibuprofen (ADVIL,MOTRIN) 200 MG tablet Take 200 mg every 6 (six) hours as needed by mouth for moderate pain. Patient not taking: Reported on 05/25/2020    [provider]   levonorgestrel-ethinyl estradiol (ALTAVERA) 0.15-30 MG-MCG tablet Take 1 tablet by mouth daily.    [provider]  Meth-Hyo-M Bl-Na Phos-Ph Sal (URIBEL) 118 MG CAPS Take 1 capsule by mouth 4 times daily 03/04/21     ondansetron (ZOFRAN ODT) 4 MG disintegrating tablet Take 1 tablet (4 mg total) by mouth every 8 (eight) hours as needed for nausea or vomiting. 05/25/20   Caccavale, Sophia, PA-C  oxyCODONE (ROXICODONE) 5 MG immediate release tablet Take 1 tablet (5 mg total) by mouth every 6 (six) hours as needed for up to 12 doses for severe pain. 03/01/21   Terald Sleeper, MD  pantoprazole (PROTONIX) 20 MG tablet Take 1 tablet (20 mg total) by mouth daily. 05/25/20   Caccavale, Sophia, PA-C  fluticasone (FLONASE) 50 MCG/ACT nasal spray Place 2 sprays into both nostrils daily. 05/25/20 05/25/20  Moshe Cipro, NP      Allergies    Latex    Review of Systems   Review of Systems  Constitutional:  Negative for fever.  HENT:  Negative for hearing loss.   Eyes:  Negative for redness.  Respiratory:  Negative for shortness of breath.   Cardiovascular:  Negative for chest pain.  Genitourinary:  Negative for vaginal bleeding.  Neurological:  Positive for syncope. Negative for headaches.  All other systems reviewed and are negative.  Physical Exam Updated Vital Signs BP 102/60 (BP Location: Left Arm)    Pulse 93  Temp 98 F (36.7 C) (Oral)    Resp 18    Ht 5\' 5"  (1.651 m)    Wt 72.6 kg    LMP 03/24/2021 (Approximate)    SpO2 99%    BMI 26.63 kg/m  Physical Exam Vitals and nursing note reviewed.  Constitutional:      General: She is not in acute distress.    Appearance: Normal appearance.  HENT:     Head: Normocephalic and atraumatic.     Nose: Nose normal.  Eyes:     Conjunctiva/sclera: Conjunctivae normal.     Pupils: Pupils are equal, round, and reactive to light.  Cardiovascular:     Rate and Rhythm: Normal rate and regular rhythm.     Pulses: Normal pulses.     Heart  sounds: Normal heart sounds.  Pulmonary:     Effort: Pulmonary effort is normal.     Breath sounds: Normal breath sounds.  Abdominal:     General: Abdomen is flat. Bowel sounds are normal.     Palpations: Abdomen is soft.     Tenderness: There is no abdominal tenderness. There is no guarding or rebound.  Musculoskeletal:        General: Normal range of motion.     Cervical back: Normal range of motion and neck supple.  Skin:    General: Skin is warm and dry.     Capillary Refill: Capillary refill takes less than 2 seconds.  Neurological:     General: No focal deficit present.     Mental Status: She is alert and oriented to person, place, and time.     Deep Tendon Reflexes: Reflexes normal.  Psychiatric:        Mood and Affect: Mood normal.        Behavior: Behavior normal.    ED Results / Procedures / Treatments   Labs (all labs ordered are listed, but only abnormal results are displayed) Labs Reviewed  PREGNANCY, URINE - Abnormal; Notable for the following components:      Result Value   Preg Test, Ur POSITIVE (*)    All other components within normal limits  CBC WITH DIFFERENTIAL/PLATELET - Abnormal; Notable for the following components:   RBC 3.83 (*)    Hemoglobin 11.7 (*)    HCT 34.6 (*)    All other components within normal limits  BASIC METABOLIC PANEL - Abnormal; Notable for the following components:   Calcium 8.5 (*)    All other components within normal limits  HCG, QUANTITATIVE, PREGNANCY - Abnormal; Notable for the following components:   hCG, Beta Chain, Quant, S 22,402 (*)    All other components within normal limits  URINALYSIS, ROUTINE W REFLEX MICROSCOPIC    EKG None  Radiology No results found.  Procedures Procedures    Medications Ordered in ED Medications - No data to display  ED Course/ Medical Decision Making/ A&P                           Medical Decision Making Patient who is G1P0 with LMP on 03/24/21 who presents with cramping and  syncope x 2  Amount and/or Complexity of Data Reviewed External Data Reviewed: notes.    Details: outside notes in care everywhere Labs: ordered.    Details: reviewed by me: pregnancy test is positive with beta HCG of 22,402.  Normal electrolytes, CBC with mild anemia at 11.7 Discussion of management or test interpretation with external provider(s): 430  Case d/w Dr. Vergie Living, call MAU if no [place]  Case d/w  nurse practioner in MAU, they will see the patient   Risk Decision regarding hospitalization. Risk Details: Patient with pregnancy of undetermined location with syncope x 2.  Will need [place] to exclude ectopic.  We do not have that capability.  Will transfer POV to MAU.      Final Clinical Impression(s) / ED Diagnoses Final diagnoses:  Syncope, unspecified syncope type  Pregnancy, location unknown      Mehki Klumpp, MD 05/11/21 8811

## 2021-05-11 NOTE — ED Triage Notes (Signed)
Possible pregnancy  LMP 03/24/2021 Positive home pregnancy Pain/cramping that started yesterday after having sex  Denies bleeding

## 2021-05-11 NOTE — ED Notes (Signed)
Patient left department with visitor at this time.  Going by POV to [facility]

## 2021-05-11 NOTE — ED Provider Notes (Signed)
Patient was transferred from [facility] and came by POV.  She is pregnant and having abdominal pain and cramping and had a syncopal episode.  She sent over to rule out ectopic pregnancy.  She was supposed to go to directly to MAU but there was miscommunication and she came to the ED first.    Patient is currently well-appearing and hemodynamically stable. I spoke to the MAU APP and made them aware of her location. She was transported to the MAU.   Nira Conn, MD 05/11/21 [phone]

## 2021-05-11 NOTE — ED Notes (Signed)
Report called to Magda Paganini, RN in MAU.

## 2021-05-11 NOTE — ED Triage Notes (Signed)
Pt c/o left sided abdominal cramping. Pt reports she is pregnant and was sent over here from [facility] to go to MAU.

## 2021-05-11 NOTE — MAU Note (Signed)
Pt transfer from [facility], then [facility] for abd pain/cramping, mainly on her left side and radiating to the back. Pt denies VB, LOF, and abnormal discharge. Last recent intercourse two days ago.

## 2021-05-11 NOTE — MAU Provider Note (Signed)
History     [id number]  Arrival date and time: 05/11/21 0147    Chief Complaint  Patient presents with   Possible Pregnancy   Abdominal Cramping     HPI Deborah Norton is a 21 y.o. at [redacted]w[redacted]d  by LMP who presents from [facility] ED for ultrasound. Presented to [facility] ED this morning for abdominal pain. Also reports syncopal episodes yesterday. Patient states she's had these episodes before & they are normally due to not eating enough or stressful events.  Lower abdominal cramping started yesterday. Pain mostly on the left side & radiates to her back. Denies fever, dysuria, or vaginal bleeding.    OB History     Gravida  1   Para      Term      Preterm      AB      Living         SAB      IAB      Ectopic      Multiple      Live Births              Past Medical History:  Diagnosis Date   Hx of migraines     Past Surgical History:  Procedure Laterality Date   HERNIA REPAIR      Family History  Problem Relation Age of Onset   Asthma Mother    Healthy Father    Healthy Brother     Allergies  Allergen Reactions   Latex Hives    No current facility-administered medications on file prior to encounter.   Current Outpatient Medications on File Prior to Encounter  Medication Sig Dispense Refill   ciprofloxacin (CIPRO) 500 MG tablet TAKE 1 TABLET (500 MG TOTAL) BY MOUTH IN THE MORNING AND 1 TABLET (500 MG TOTAL) IN THE EVENING FOR 10 DAYS. 20 tablet 0   dextromethorphan-guaiFENesin (MUCINEX DM) 30-600 MG 12hr tablet Take 1 tablet 2 (two) times daily as needed by mouth for cough. (Patient not taking: Reported on 05/25/2020)     dicyclomine (BENTYL) 20 MG tablet Take 1 tablet (20 mg total) by mouth 2 (two) times daily as needed for spasms. 20 tablet 0   doxycycline (VIBRAMYCIN) 100 MG capsule Take 1 capsule (100 mg total) by mouth in the morning and 1 capsule (100 mg total) in the evening. Do all this for 7 days. Take with at least 8 ounces (large glass) of water,  do not lie down for 30 minutes after. 14 capsule 0   ibuprofen (ADVIL,MOTRIN) 200 MG tablet Take 200 mg every 6 (six) hours as needed by mouth for moderate pain. (Patient not taking: Reported on 05/25/2020)     levonorgestrel-ethinyl estradiol (ALTAVERA) 0.15-30 MG-MCG tablet Take 1 tablet by mouth daily.     Meth-Hyo-M Bl-Na Phos-Ph Sal (URIBEL) 118 MG CAPS Take 1 capsule by mouth 4 times daily 20 capsule 0   ondansetron (ZOFRAN ODT) 4 MG disintegrating tablet Take 1 tablet (4 mg total) by mouth every 8 (eight) hours as needed for nausea or vomiting. 20 tablet 0   oxyCODONE (ROXICODONE) 5 MG immediate release tablet Take 1 tablet (5 mg total) by mouth every 6 (six) hours as needed for up to 12 doses for severe pain. 12 tablet 0   pantoprazole (PROTONIX) 20 MG tablet Take 1 tablet (20 mg total) by mouth daily. 14 tablet 0   [DISCONTINUED] fluticasone (FLONASE) 50 MCG/ACT nasal spray Place 2 sprays into both nostrils daily. 9.9 mL 2  ROS Pertinent positives and negative per HPI, all others reviewed and negative  Physical Exam   BP (!) 112/54 (BP Location: Right Arm)    Pulse 64    Temp 98.4 F (36.9 C)    Resp 16    Ht 5\' 5"  (1.651 m)    Wt 80.6 kg    LMP 03/24/2021 (Approximate)    SpO2 99%    BMI 29.55 kg/m   Patient Vitals for the past 24 hrs:  BP Temp Temp src Pulse Resp SpO2 Height Weight  05/11/21 0620 (!) 112/54 98.4 F (36.9 C) -- 64 16 99 % 5\' 5"  (1.651 m) 80.6 kg  05/11/21 0553 (!) 109/55 98.4 F (36.9 C) Oral 66 16 98 % -- --  05/11/21 0553 -- -- -- -- -- -- 5\' 5"  (1.651 m) 74.8 kg  05/11/21 0440 120/65 -- -- 84 18 100 % -- --  05/11/21 0439 117/62 -- -- 85 -- 100 % -- --  05/11/21 0437 114/66 -- -- 71 -- 100 % -- --  05/11/21 0156 102/60 98 F (36.7 C) Oral 93 18 99 % -- --  05/11/21 0153 -- -- -- -- -- -- 5\' 5"  (1.651 m) 72.6 kg    Physical Exam Vitals and nursing note reviewed.  Constitutional:      Appearance: Normal appearance.  HENT:     Head: Normocephalic  and atraumatic.  Eyes:     General: No scleral icterus.    Conjunctiva/sclera: Conjunctivae normal.  Cardiovascular:     Rate and Rhythm: Normal rate and regular rhythm.  Pulmonary:     Effort: Pulmonary effort is normal. No respiratory distress.  Abdominal:     Palpations: Abdomen is soft.     Tenderness: There is no abdominal tenderness.  Neurological:     Mental Status: She is alert.  Psychiatric:        Mood and Affect: Mood normal.        Behavior: Behavior normal.        Labs Results for orders placed or performed during the hospital encounter of 05/11/21 (from the past 24 hour(s))  Pregnancy, urine     Status: Abnormal   Collection Time: 05/11/21  1:58 AM  Result Value Ref Range   Preg Test, Ur POSITIVE (A) NEGATIVE  Urinalysis, Routine w reflex microscopic Urine, Clean Catch     Status: None   Collection Time: 05/11/21  1:58 AM  Result Value Ref Range   Color, Urine YELLOW YELLOW   APPearance CLEAR CLEAR   Specific Gravity, Urine >=1.030 1.005 - 1.030   pH 6.0 5.0 - 8.0   Glucose, UA NEGATIVE NEGATIVE mg/dL   Hgb urine dipstick NEGATIVE NEGATIVE   Bilirubin Urine NEGATIVE NEGATIVE   Ketones, ur NEGATIVE NEGATIVE mg/dL   Protein, ur NEGATIVE NEGATIVE mg/dL   Nitrite NEGATIVE NEGATIVE   Leukocytes,Ua NEGATIVE NEGATIVE  CBC with Differential/Platelet     Status: Abnormal   Collection Time: 05/11/21  2:53 AM  Result Value Ref Range   WBC 8.2 4.0 - 10.5 K/uL   RBC 3.83 (L) 3.87 - 5.11 MIL/uL   Hemoglobin 11.7 (L) 12.0 - 15.0 g/dL   HCT 05/13/21 (L) 05/13/21 - 05/13/21 %   MCV 90.3 80.0 - 100.0 fL   MCH 30.5 26.0 - 34.0 pg   MCHC 33.8 30.0 - 36.0 g/dL   RDW 05/13/21 30.0 - 76.2 %   Platelets 314 150 - 400 K/uL   nRBC 0.0 0.0 - 0.2 %  Neutrophils Relative % 68 %   Neutro Abs 5.5 1.7 - 7.7 K/uL   Lymphocytes Relative 24 %   Lymphs Abs 2.0 0.7 - 4.0 K/uL   Monocytes Relative 7 %   Monocytes Absolute 0.6 0.1 - 1.0 K/uL   Eosinophils Relative 1 %   Eosinophils Absolute 0.1  0.0 - 0.5 K/uL   Basophils Relative 0 %   Basophils Absolute 0.0 0.0 - 0.1 K/uL   Immature Granulocytes 0 %   Abs Immature Granulocytes 0.02 0.00 - 0.07 K/uL  Basic metabolic panel     Status: Abnormal   Collection Time: 05/11/21  2:53 AM  Result Value Ref Range   Sodium 135 135 - 145 mmol/L   Potassium 3.6 3.5 - 5.1 mmol/L   Chloride 107 98 - 111 mmol/L   CO2 22 22 - 32 mmol/L   Glucose, Bld 91 70 - 99 mg/dL   BUN 13 6 - 20 mg/dL   Creatinine, Ser 1.610.56 0.44 - 1.00 mg/dL   Calcium 8.5 (L) 8.9 - 10.3 mg/dL   GFR, Estimated >09>60 >60>60 mL/min   Anion gap 6 5 - 15  hCG, quantitative, pregnancy     Status: Abnormal   Collection Time: 05/11/21  2:53 AM  Result Value Ref Range   hCG, Beta Chain, Quant, S 22,402 (H) <5 mIU/mL    Imaging US OB LESS THAN 14 WEEKS WITH OB TRANSVAGINAL  Result Date: 05/11/2021 CLINICAL DATA:  21 year old female with left lower quadrant cramping in the 1st trimester of pregnancy. Quantitative beta HCG 22,402. Estimated gestational age by LMP 6 weeks and 6 days. EXAM: OBSTETRIC <14 WK US AND TRANSVAGINAL OB US TECHNIQUE: Both transabdominal and transvaginal ultrasound examinations were performed for complete evaluation of the gestation as well as the maternal uterus, adnexal regions, and pelvic cul-de-sac. Transvaginal technique was performed to assess early pregnancy. COMPARISON:  CT Abdomen and Pelvis 03/01/2021. FINDINGS: Intrauterine gestational sac: Single Yolk sac:  Visible Embryo:  Visible Cardiac Activity: Detected Heart Rate: 108 bpm CRL:  3.5 mm   6 w   0 d                  US EDC: 01/04/2022 Subchorionic hemorrhage:  None visualized. Maternal uterus/adnexae: No pelvic free fluid. The right ovary appears normal measuring 3.9 x 2.0 x 1.9 cm. The left ovary appears symmetric and normal measuring 4.0 x 2.3 x 2.1 cm. IMPRESSION: Single living IUP demonstrated with estimated gestational age of [redacted] weeks and 0 days by crown-rump length. No acute maternal findings  visualized. Electronically Signed   By: Odessa FlemingH  Hall M.D.   On: 05/11/2021 07:12    MAU Course  Procedures Lab Orders         Pregnancy, urine         Urinalysis, Routine w reflex microscopic Urine, Clean Catch         CBC with Differential/Platelet         Basic metabolic panel         hCG, quantitative, pregnancy    No orders of the defined types were placed in this encounter.  Imaging Orders         US OB LESS THAN 14 WEEKS WITH OB TRANSVAGINAL     MDM Patient evaluated in ED for syncope & abdominal pain. Sent here for imaging as ultrasound is not available at their facility right now.  Ultrasound shows live IUP measuring 1530w0d (EDD updated per ACOG guidelines) Assessment and Plan  1. Normal IUP (intrauterine pregnancy) on prenatal ultrasound, first trimester   2. Syncope, unspecified syncope type  -labs normal. Vitals normal. Patient asymptomatic -discussed water intake & eating at normal intervals. May need cards referral if continues  3. Abdominal pain during pregnancy in first trimester  -negative u/a -IUP seen on ultrasound -reviewed SAB precautions  4. [redacted] weeks gestation of pregnancy      Judeth Horn, NP 05/11/21 7:29 AM

## 2021-11-12 ENCOUNTER — Emergency Department (HOSPITAL_BASED_OUTPATIENT_CLINIC_OR_DEPARTMENT_OTHER)
Admission: EM | Admit: 2021-11-12 | Discharge: 2021-11-12 | Disposition: A | Discharge status: Home / Self Care | Payer: No Typology Code available for payment source | Attending: Emergency Medicine | Admitting: Emergency Medicine

## 2021-11-12 ENCOUNTER — Other Ambulatory Visit: Payer: Self-pay

## 2021-11-12 ENCOUNTER — Encounter (HOSPITAL_BASED_OUTPATIENT_CLINIC_OR_DEPARTMENT_OTHER): Payer: Self-pay | Admitting: Emergency Medicine

## 2021-11-12 DIAGNOSIS — J029 Acute pharyngitis, unspecified: Secondary | ICD-10-CM | POA: Diagnosis present

## 2021-11-12 DIAGNOSIS — B9789 Other viral agents as the cause of diseases classified elsewhere: Secondary | ICD-10-CM

## 2021-11-12 DIAGNOSIS — Z20822 Contact with and (suspected) exposure to covid-19: Secondary | ICD-10-CM | POA: Insufficient documentation

## 2021-11-12 LAB — GROUP A STREP BY PCR: Group A Strep by PCR: NOT DETECTED

## 2021-11-12 LAB — PREGNANCY, URINE: Preg Test, Ur: NEGATIVE

## 2021-11-12 LAB — SARS CORONAVIRUS 2 BY RT PCR: SARS Coronavirus 2 by RT PCR: NEGATIVE

## 2021-11-12 MED ORDER — ACETAMINOPHEN 500 MG PO TABS
1000.0000 mg | ORAL_TABLET | Freq: Once | ORAL | Status: AC
  Administered 2021-11-12: 1000 mg via ORAL
  Filled 2021-11-12: qty 2

## 2021-11-12 MED ORDER — DEXAMETHASONE 4 MG PO TABS
10.0000 mg | ORAL_TABLET | Freq: Once | ORAL | Status: AC
  Administered 2021-11-12: 10 mg via ORAL
  Filled 2021-11-12: qty 3

## 2021-11-12 NOTE — ED Provider Notes (Signed)
[facility] EMERGENCY DEPT Provider Note   CSN: [id number] Arrival date & time: 11/12/21  1122     History No chief complaint on file.   HPI Deborah Norton is a 21 y.o. female presenting for sore throat x4 days.  She states that 2 members at her job have been diagnosed with strep throat and she is worried that she was exposed.  She has had pharyngitis symptoms as well as a dry cough.  She denies fevers or chills nausea or vomiting, syncope or shortness of breath.  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   Patient's recorded medical, surgical, social, medication list and allergies were reviewed in the Snapshot window as part of the initial history.   Review of Systems   Review of Systems  Constitutional:  Negative for chills and fever.  HENT:  Positive for sore throat. Negative for ear pain.   Eyes:  Negative for pain and visual disturbance.  Respiratory:  Negative for cough and shortness of breath.   Cardiovascular:  Negative for chest pain and palpitations.  Gastrointestinal:  Negative for abdominal pain and vomiting.  Genitourinary:  Negative for dysuria and hematuria.  Musculoskeletal:  Negative for arthralgias and back pain.  Skin:  Negative for color change and rash.  Neurological:  Negative for seizures and syncope.  All other systems reviewed and are negative.   Physical Exam Updated Vital Signs BP 110/61   Pulse 84   Temp 98.3 F (36.8 C)   Resp 18   Ht 5\' 5"  (1.651 m)   Wt 86.2 kg   LMP 10/08/2021 (Exact Date)   SpO2 100%   Breastfeeding Unknown   BMI 31.62 kg/m  Physical Exam Vitals and nursing note reviewed.  Constitutional:      General: She is not in acute distress.    Appearance: She is well-developed.  HENT:     Head: Normocephalic and atraumatic.     Mouth/Throat:     Pharynx: Posterior oropharyngeal erythema present. No oropharyngeal exudate.  Eyes:     Conjunctiva/sclera: Conjunctivae  normal.  Cardiovascular:     Rate and Rhythm: Normal rate and regular rhythm.     Heart sounds: No murmur heard. Pulmonary:     Effort: Pulmonary effort is normal. No respiratory distress.     Breath sounds: Normal breath sounds.  Abdominal:     General: There is no distension.     Palpations: Abdomen is soft.     Tenderness: There is no abdominal tenderness. There is no right CVA tenderness or left CVA tenderness.  Musculoskeletal:        General: No swelling or tenderness. Normal range of motion.     Cervical back: Neck supple.  Skin:    General: Skin is warm and dry.  Neurological:     General: No focal deficit present.     Mental Status: She is alert and oriented to person, place, and time. Mental status is at baseline.     Cranial Nerves: No cranial nerve deficit.      ED Course/ Medical Decision Making/ A&P    Procedures Procedures   Medications Ordered in ED Medications  dexamethasone (DECADRON) tablet 10 mg (10 mg Oral Given 11/12/21 1213)  acetaminophen (TYLENOL) tablet 1,000 mg (1,000 mg Oral Given 11/12/21 1212)    Medical Decision Making:    Deborah Norton is a 21 y.o. female who presented to the ED today with sore throat detailed above.  Complete initial physical exam performed, notably the patient  was hemodynamically stable in no acute distress.  She has visible pharyngitis and inflammation in her posterior oropharynx.      Reviewed and confirmed nursing documentation for past medical history, family history, social history.    Initial Assessment:   With the patient's presentation of sore throat, most likely diagnosis is viral pharyngitis versus streptococcal pharyngitis. Other diagnoses were considered including (but not limited to) mononucleosis, RPA, PTA. These are considered less likely due to history of present illness and physical exam findings.   This is most consistent with an acute complicated illness  Initial Plan:  We will test patient for  COVID, flu, Streptococcus.  Patient Centor score is low warranting no antibiotics if strep screen is negative Will empirically treat patient's symptoms with p.o. Decadron, anti-inflammatories and plan for patient follow-up with PCP for reassessment. Objective evaluation as below reviewed with plan for close reassessment  Initial Study Results:   Laboratory  All laboratory results reviewed without evidence of clinically relevant pathology.     Final Assessment and Plan:   Laboratory evaluation resulted with no acute infection identified.  Likely remains viral upper respiratory infection.  Strict supportive care reinforced to the patient and patient expressed understanding.  Patient discharged with no further acute events.    Clinical Impression:  1. Sore throat (viral)      Discharge   Final Clinical Impression(s) / ED Diagnoses Final diagnoses:  Sore throat (viral)    Rx / DC Orders ED Discharge Orders     None         Glyn Ade, MD 11/12/21 1323

## 2021-11-12 NOTE — ED Triage Notes (Signed)
Pt here from home with c/o sore throat , it is red and sore to swallow , also wants a pregnancy test

## 2022-04-12 ENCOUNTER — Other Ambulatory Visit (HOSPITAL_BASED_OUTPATIENT_CLINIC_OR_DEPARTMENT_OTHER): Payer: Self-pay

## 2022-04-12 MED ORDER — OSELTAMIVIR PHOSPHATE 75 MG PO CAPS
75.0000 mg | ORAL_CAPSULE | Freq: Two times a day (BID) | ORAL | 0 refills | Status: AC
  Filled 2022-04-12: qty 10, 5d supply, fill #0

## 2022-09-06 ENCOUNTER — Other Ambulatory Visit (HOSPITAL_BASED_OUTPATIENT_CLINIC_OR_DEPARTMENT_OTHER): Payer: Self-pay

## 2022-09-06 MED ORDER — NITROFURANTOIN MONOHYD MACRO 100 MG PO CAPS
100.0000 mg | ORAL_CAPSULE | Freq: Two times a day (BID) | ORAL | 0 refills | Status: AC
  Filled 2022-09-06: qty 10, 5d supply, fill #0

## 2022-09-14 IMAGING — US US ABDOMEN LIMITED RUQ/ASCITES
1 series · 14 of 25 positions shown · non-contrast
Comparison: None.

CLINICAL DATA: Postprandial pain for 3 days.

EXAM:
ULTRASOUND ABDOMEN LIMITED RIGHT UPPER QUADRANT

[Series 1: us abdomen limited ruq/ascites · 14 of 36 slices shown]
[im 1/36]
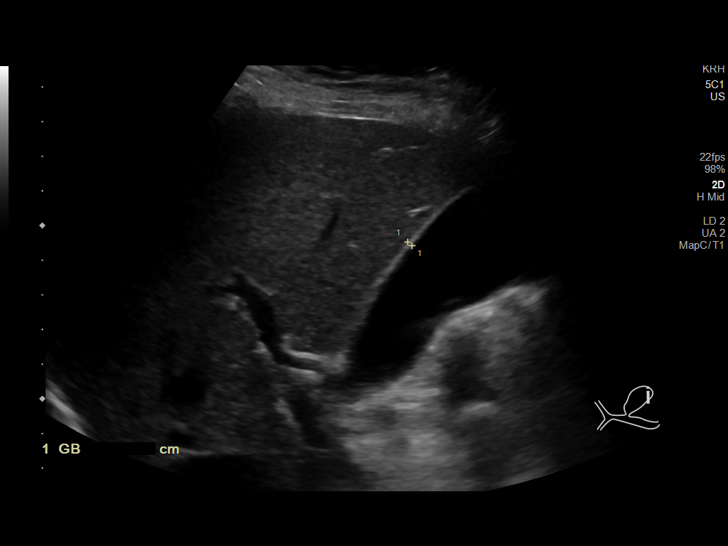
[im 3/36]
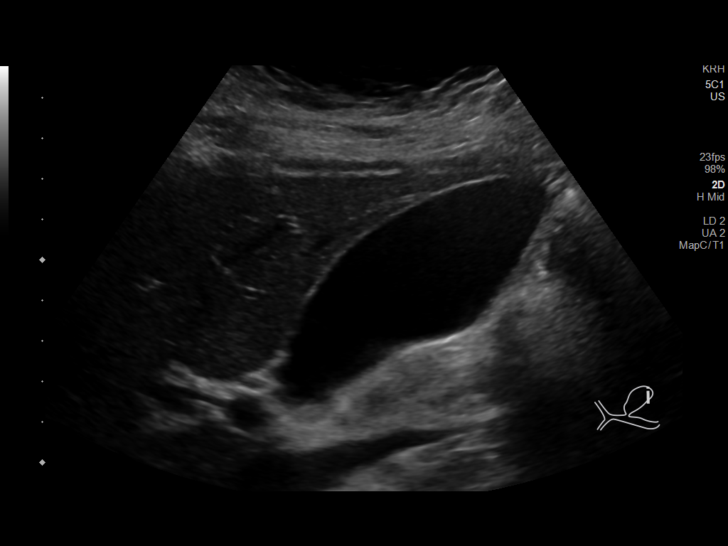
[im 6/36]
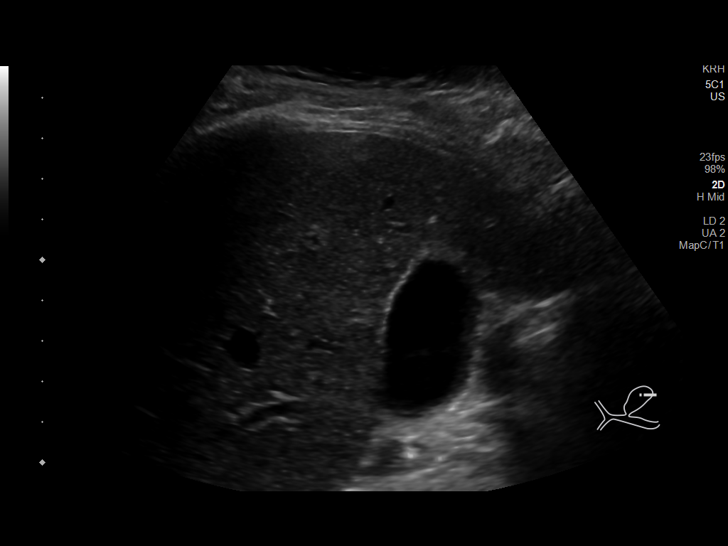
[im 9/36]
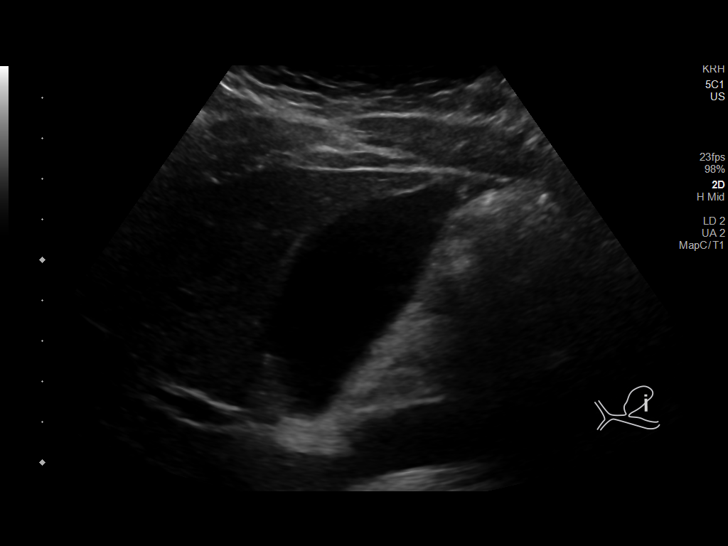
[im 12/36]
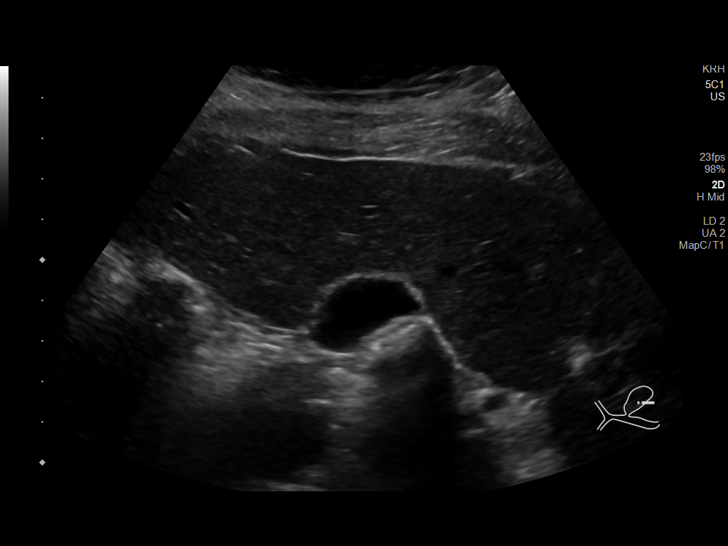
[im 14/36]
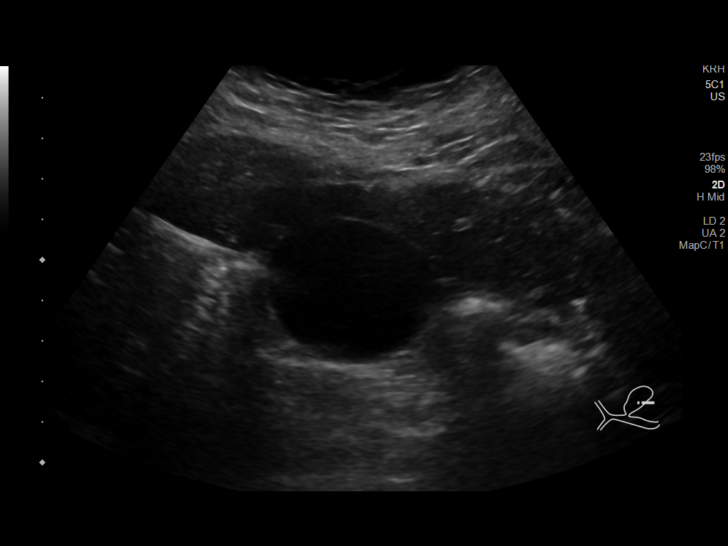
[im 17/36]
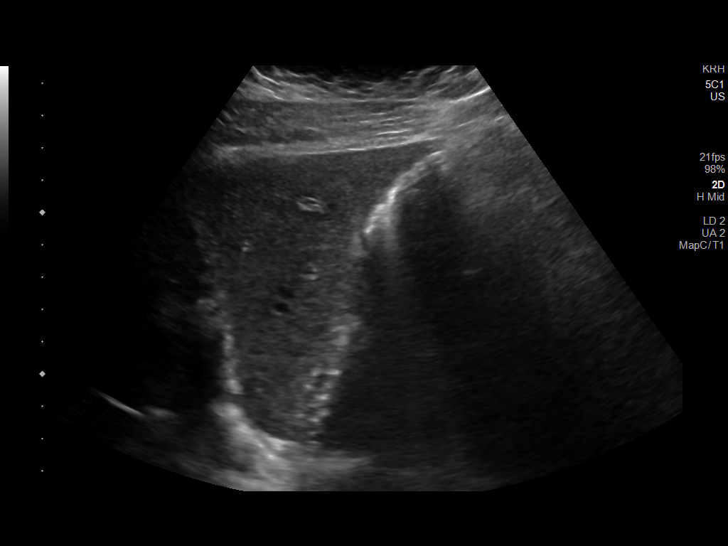
[im 19/36]
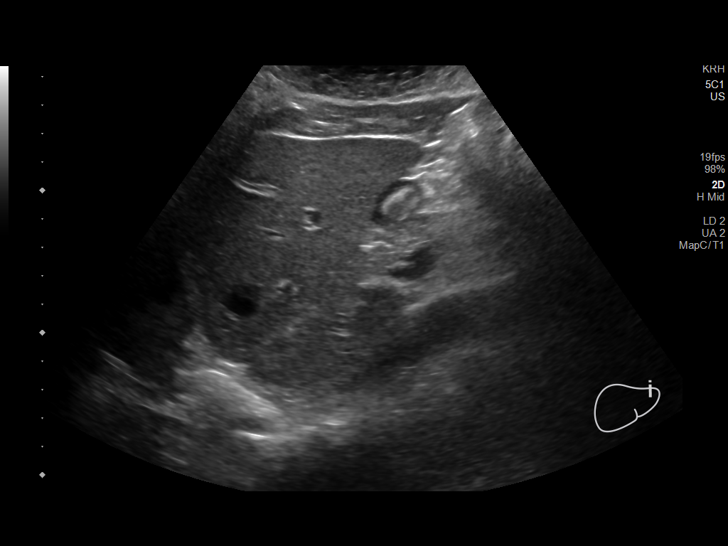
[im 22/36]
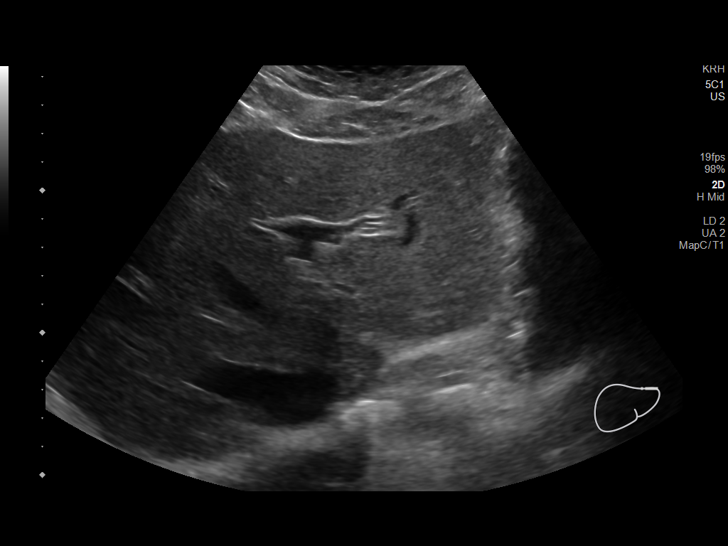
[im 24/36]
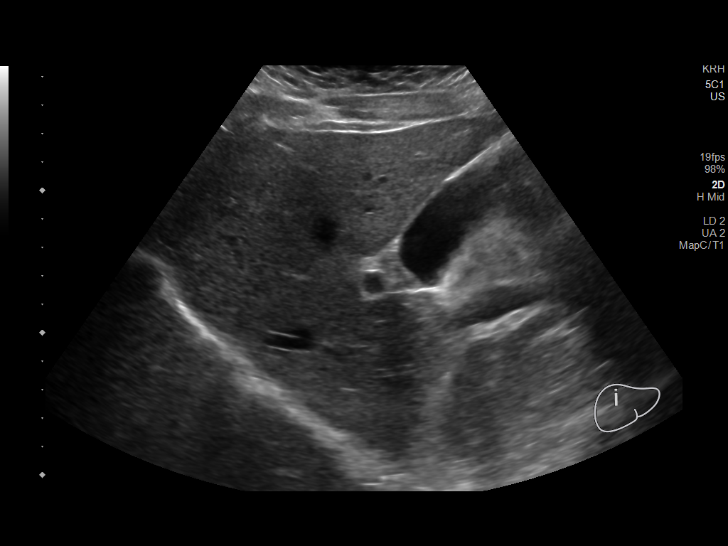
[im 27/36]
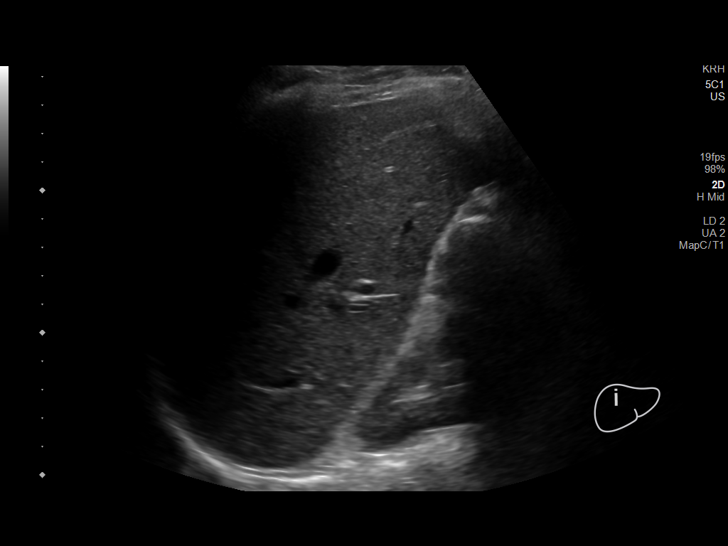
[im 30/36]
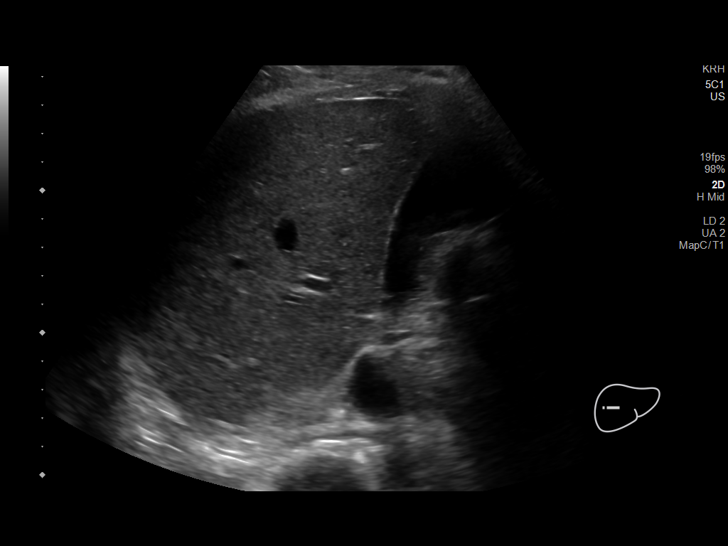
[im 33/36]
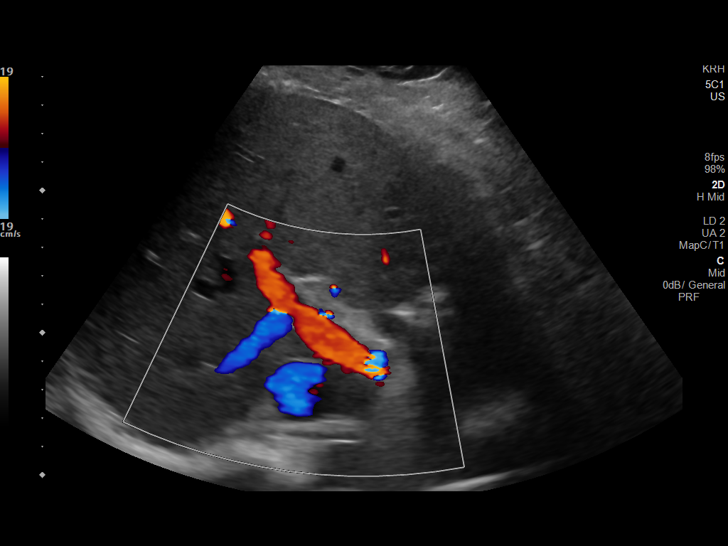
[im 36/36]
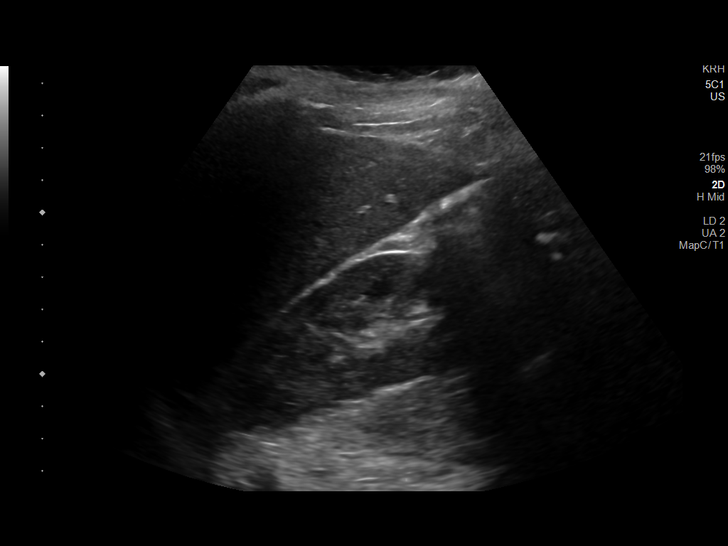

[14 of 25 positions shown; findings below may reference images not displayed]

FINDINGS: Gallbladder:

No gallstones or wall thickening visualized. No sonographic Murphy
sign noted by sonographer.

Common bile duct:

Diameter: Normal, 3 mm.

Liver:

No focal lesion identified. Within normal limits in parenchymal
echogenicity. Portal vein is patent on color Doppler imaging with
normal direction of blood flow towards the liver.

Other: None.
IMPRESSION: Normal right upper quadrant ultrasound. No explanation for patient's
symptoms.

## 2022-11-24 ENCOUNTER — Other Ambulatory Visit (HOSPITAL_BASED_OUTPATIENT_CLINIC_OR_DEPARTMENT_OTHER): Payer: Self-pay

## 2022-11-24 ENCOUNTER — Ambulatory Visit
Admission: EM | Admit: 2022-11-24 | Discharge: 2022-11-24 | Disposition: A | Discharge status: Home / Self Care | Payer: 59 | Attending: Internal Medicine | Admitting: Internal Medicine

## 2022-11-24 DIAGNOSIS — S21039A Puncture wound without foreign body of unspecified breast, initial encounter: Secondary | ICD-10-CM

## 2022-11-24 DIAGNOSIS — N61 Mastitis without abscess: Secondary | ICD-10-CM | POA: Diagnosis not present

## 2022-11-24 MED ORDER — DOXYCYCLINE HYCLATE 100 MG PO CAPS
100.0000 mg | ORAL_CAPSULE | Freq: Two times a day (BID) | ORAL | 0 refills | Status: AC
  Filled 2022-11-24 (×2): qty 20, 10d supply, fill #0

## 2022-11-24 MED ORDER — IBUPROFEN 600 MG PO TABS
600.0000 mg | ORAL_TABLET | Freq: Four times a day (QID) | ORAL | 0 refills | Status: AC | PRN
  Filled 2022-11-24 (×2): qty 30, 8d supply, fill #0

## 2022-11-24 NOTE — ED Notes (Signed)
I was chaperone when exam with provider.

## 2022-11-24 NOTE — ED Triage Notes (Signed)
Pt reports redness, swelling in the left nipple  x 1 week; chills and fatigue started today. 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 Antibacterial soap gives no relief.

## 2022-11-24 NOTE — Discharge Instructions (Signed)
Please start doxycycline for the infection. Press on the area to get the drainage out. Use ibuprofen for pain and inflammation. If you worsen, go to the emergency room.

## 2022-11-24 NOTE — ED Provider Notes (Signed)
[facility]  Note:  This document was prepared using Conservation officer, historic buildings and may include unintentional dictation errors.  MRN: [id number] DOB: 08/02/2000  Subjective:   Deborah Norton is a 22 y.o. female presenting for 1 week history of acute onset persistent and worsening left nipple.  Patient has had this piercing for 2 years.  She has not removed the piercing itself.  Has never had issues with it the right side does not have any issues.  No current facility-administered medications for this encounter.  Current Outpatient Medications:    dicyclomine (BENTYL) 20 MG tablet, Take 1 tablet (20 mg total) by mouth 2 (two) times daily as needed for spasms., Disp: 20 tablet, Rfl: 0   Meth-Hyo-M Bl-Na Phos-Ph Sal (URIBEL) 118 MG CAPS, Take 1 capsule by mouth 4 times daily, Disp: 20 capsule, Rfl: 0   nitrofurantoin, macrocrystal-monohydrate, (MACROBID) 100 MG capsule, Take 1 capsule (100 mg total) by mouth every 12 (twelve) hours for 5 days., Disp: 10 capsule, Rfl: 0   ondansetron (ZOFRAN ODT) 4 MG disintegrating tablet, Take 1 tablet (4 mg total) by mouth every 8 (eight) hours as needed for nausea or vomiting., Disp: 20 tablet, Rfl: 0   oseltamivir (TAMIFLU) 75 MG capsule, Take 1 capsule (75 mg total) by mouth 2 (two) times daily., Disp: 10 capsule, Rfl: 0   pantoprazole (PROTONIX) 20 MG tablet, Take 1 tablet (20 mg total) by mouth daily., Disp: 14 tablet, Rfl: 0   Allergies  Allergen Reactions   Latex Hives    Past Medical History:  Diagnosis Date   Hx of migraines      Past Surgical History:  Procedure Laterality Date   HERNIA REPAIR      Family History  Problem Relation Age of Onset   Asthma Mother    Healthy Father    Healthy Brother     Social History   Tobacco Use   Smoking status: Every Day    Types: E-cigarettes   Smokeless tobacco: Never  Vaping Use   Vaping status: Every Day   Substances: Nicotine, Flavoring  Substance Use  Topics   Alcohol use: Yes    Alcohol/week: 1.0 standard drink of alcohol    Types: 1 Standard drinks or equivalent per week   Drug use: Yes    Frequency: 2.0 times per week    Types: Marijuana    ROS   Objective:   Vitals: BP 119/79 (BP Location: Left Arm)   Pulse 83   Temp 99.2 F (37.3 C) (Oral)   Resp 18   LMP 10/25/2022 (Approximate)   SpO2 98%   Breastfeeding No   Physical Exam Constitutional:      General: She is not in acute distress.    Appearance: Normal appearance. She is well-developed. She is not ill-appearing, toxic-appearing or diaphoretic.  HENT:     Head: Normocephalic and atraumatic.     Nose: Nose normal.     Mouth/Throat:     Mouth: Mucous membranes are moist.  Eyes:     General: No scleral icterus.       Right eye: No discharge.        Left eye: No discharge.     Extraocular Movements: Extraocular movements intact.  Cardiovascular:     Rate and Rhythm: Normal rate.  Pulmonary:     Effort: Pulmonary effort is normal.  Chest:    Skin:    General: Skin is warm and dry.  Neurological:  General: No focal deficit present.     Mental Status: She is alert and oriented to person, place, and time.  Psychiatric:        Mood and Affect: Mood normal.        Behavior: Behavior normal.     Patient refused removal of the piercing.  Assessment and Plan :   PDMP not reviewed this encounter.  1. Infected pierced nipple    Recommended starting doxycycline for the infected pierced nipple.  Use ibuprofen for pain relief.  Counseled patient on potential for adverse effects with medications prescribed/recommended today, ER and return-to-clinic precautions discussed, patient verbalized understanding.    Wallis Bamberg, [place] 11/24/22 1940

## 2023-01-13 ENCOUNTER — Other Ambulatory Visit (HOSPITAL_BASED_OUTPATIENT_CLINIC_OR_DEPARTMENT_OTHER): Payer: Self-pay

## 2023-01-13 MED ORDER — FLULAVAL 0.5 ML IM SUSY
0.5000 mL | PREFILLED_SYRINGE | Freq: Once | INTRAMUSCULAR | 0 refills | Status: AC
  Filled 2023-01-13: qty 0.5, 1d supply, fill #0

## 2023-08-31 IMAGING — US US OB < 14 WEEKS - US OB TV
1 series · 15 of 28 positions shown · non-contrast
Comparison: CT Abdomen and Pelvis 03/01/2021.

CLINICAL DATA: 20-year-old female with left lower quadrant cramping
in the 1st trimester of pregnancy. Quantitative beta HCG [DATE].
Estimated gestational age by LMP 6 weeks and 6 days.

EXAM:
OBSTETRIC <14 WK US AND TRANSVAGINAL OB US
TECHNIQUE: Both transabdominal and transvaginal ultrasound examinations were
performed for complete evaluation of the gestation as well as the
maternal uterus, adnexal regions, and pelvic cul-de-sac.
Transvaginal technique was performed to assess early pregnancy.

[Series 1: us ob < 14 weeks - us ob tv · 15 of 42 slices shown]
[im 1/42]
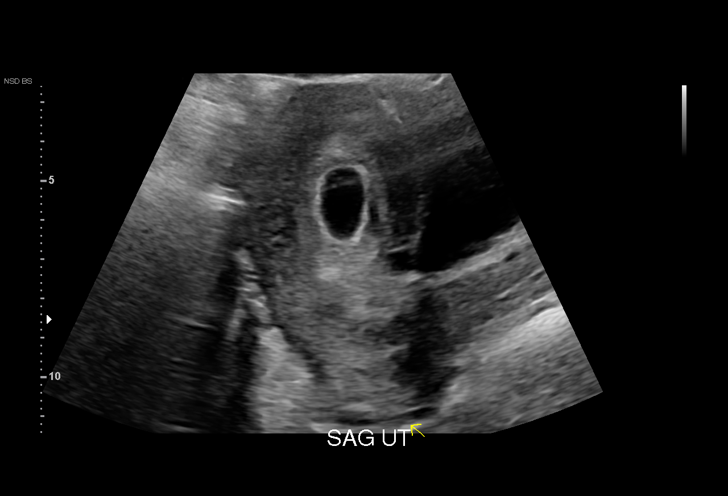
[im 4/42]
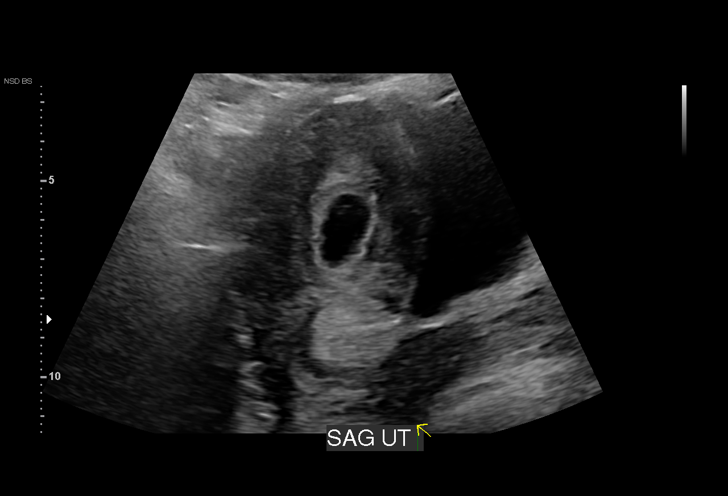
[im 7/42]
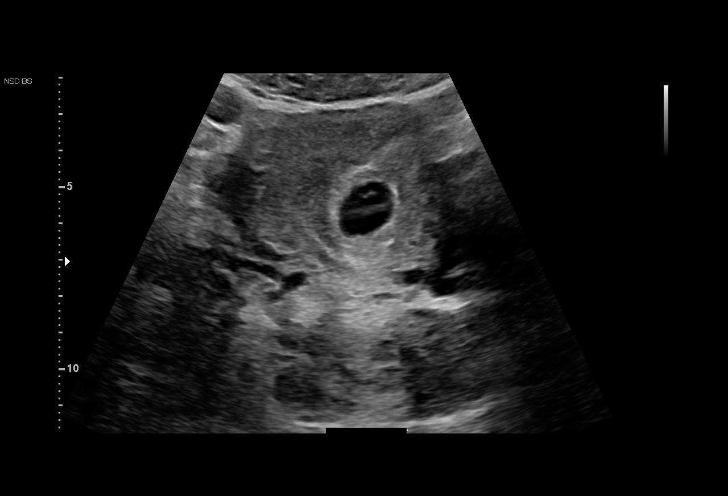
[im 10/42]
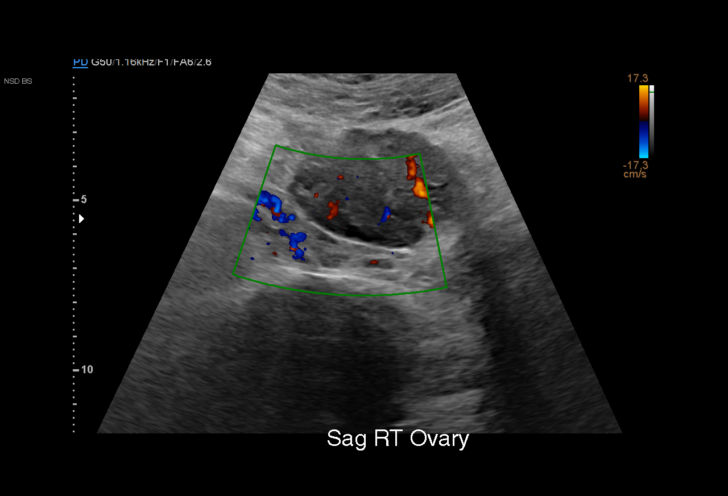
[im 13/42]
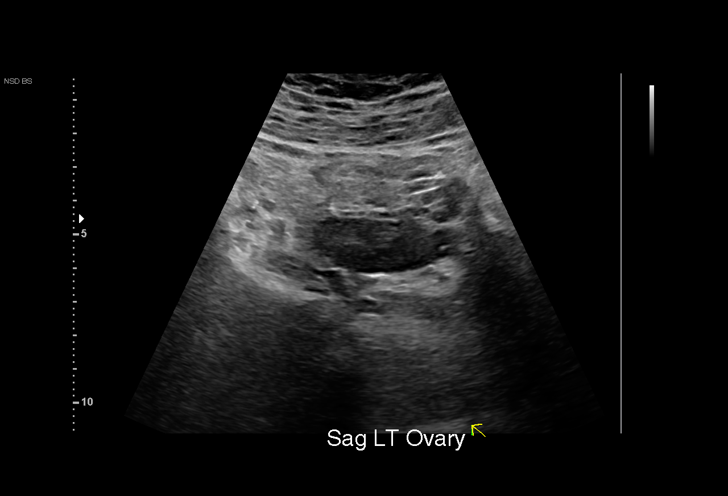
[im 16/42]
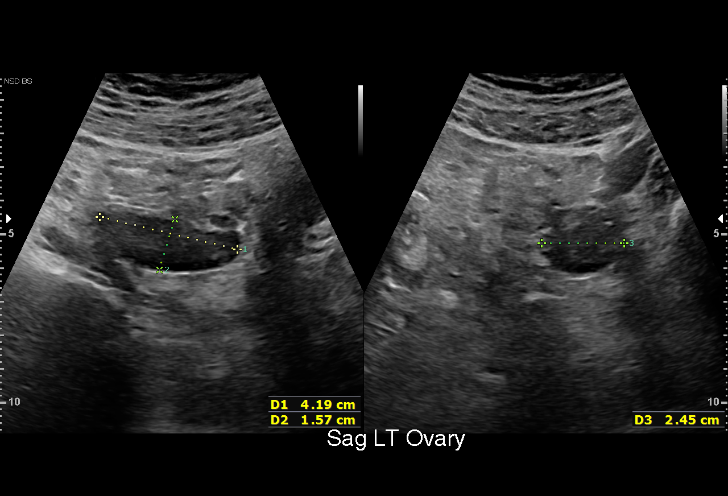
[im 19/42]
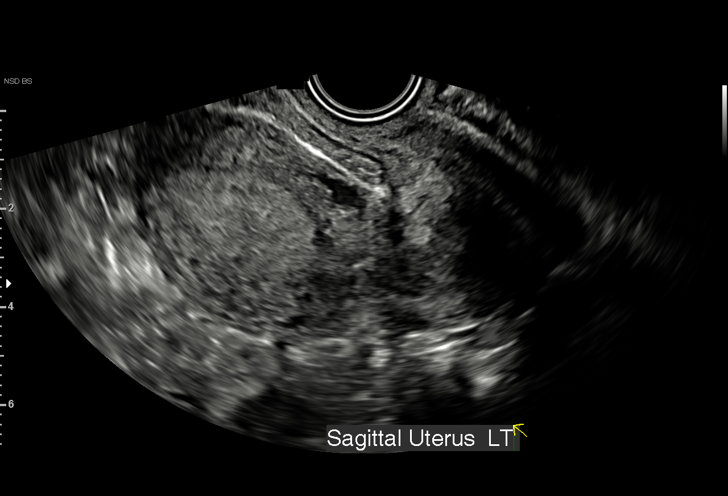
[im 22/42]
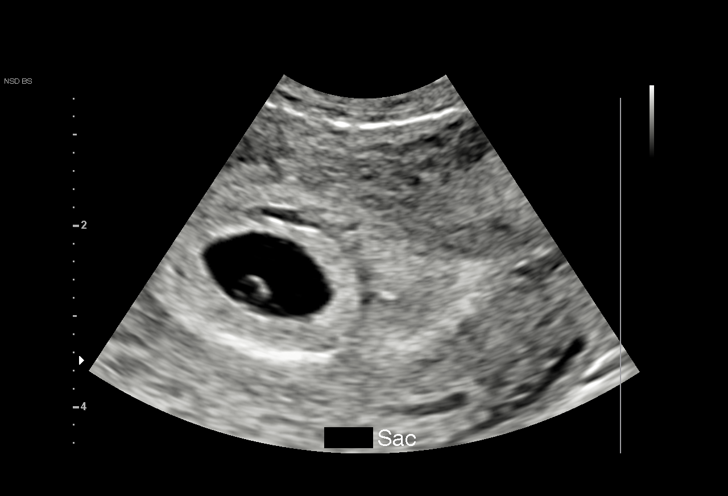
[im 23/42]
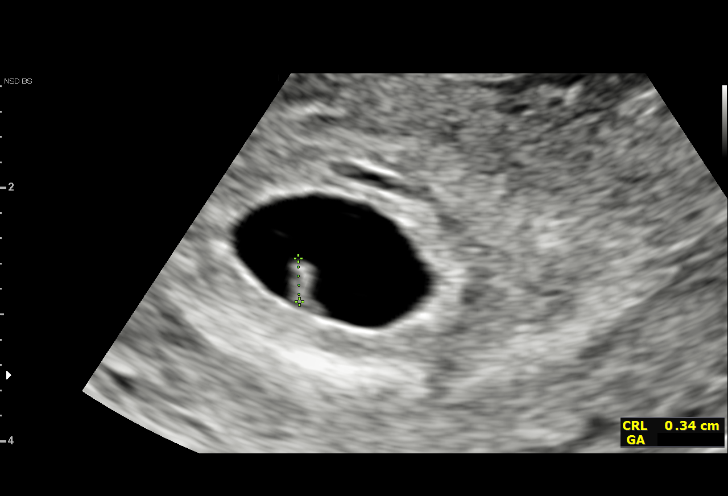
[im 26/42]
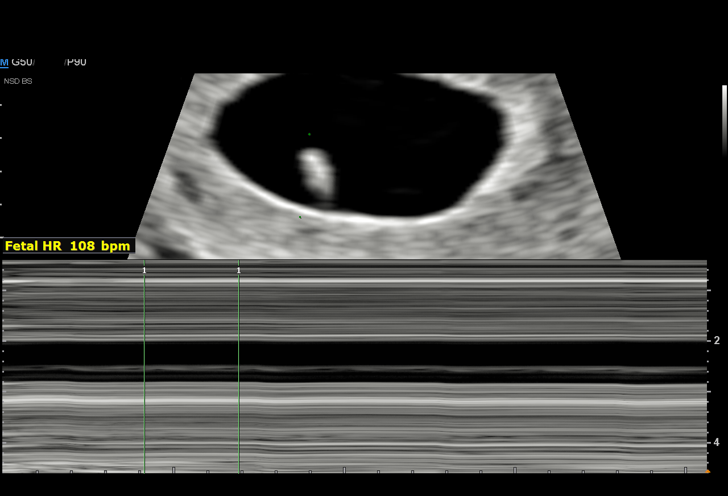
[im 29/42]
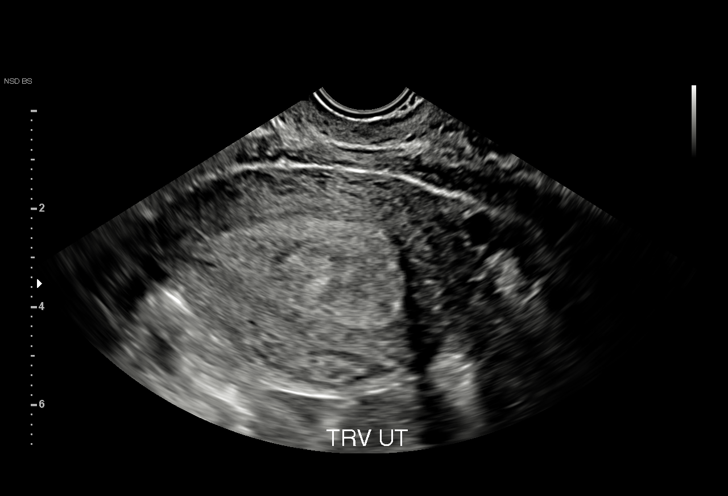
[im 32/42]
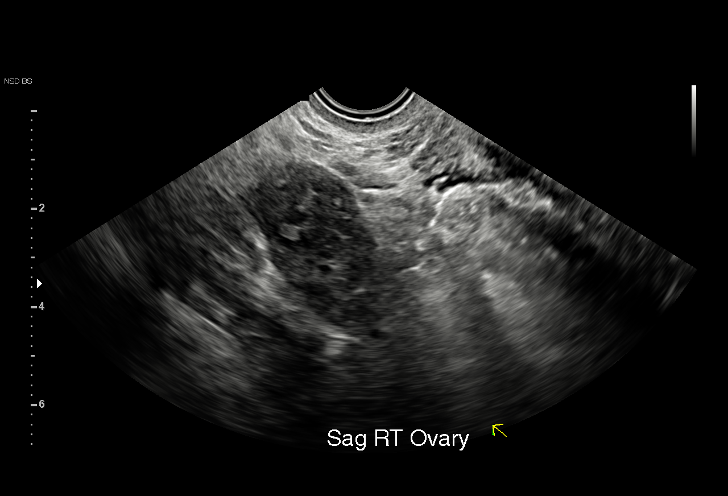
[im 35/42]
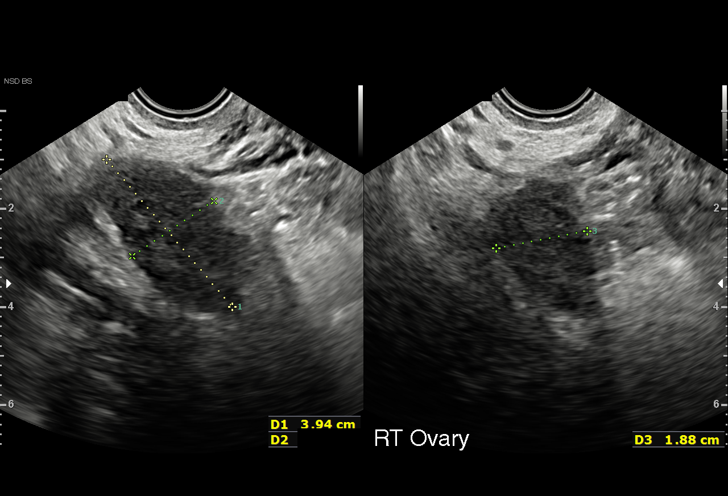
[im 38/42]
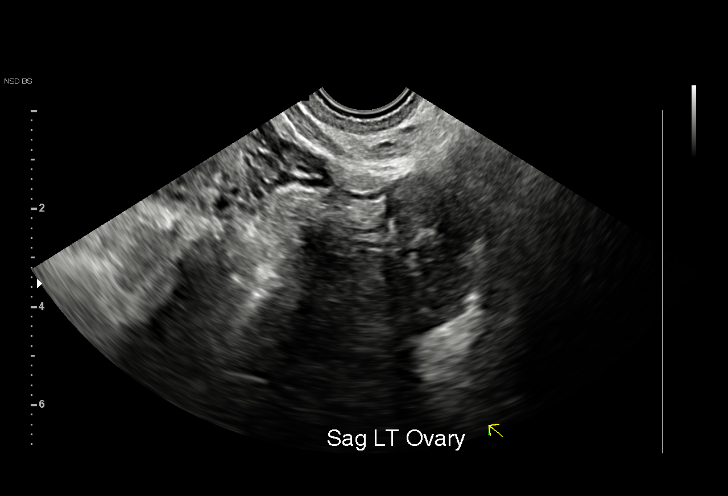
[im 42/42]
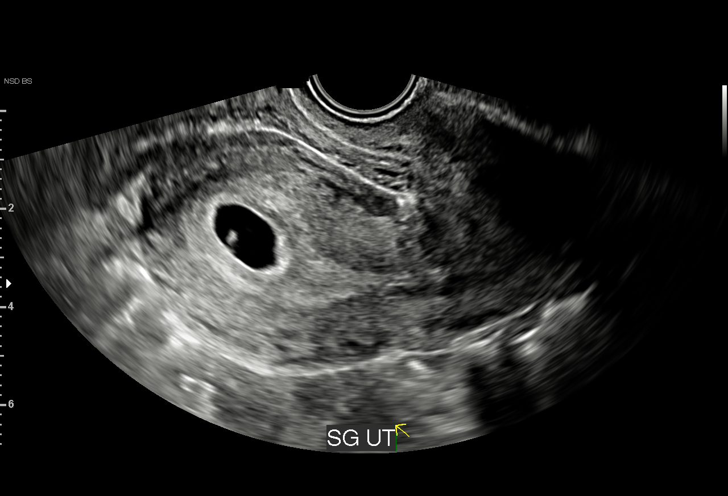

[15 of 28 positions shown; findings below may reference images not displayed]

FINDINGS: Intrauterine gestational sac: Single

Yolk sac:  Visible

Embryo:  Visible

Cardiac Activity: Detected

Heart Rate: 108 bpm

CRL:  3.5 mm   6 w   0 d                  US EDC: 01/04/2022

Subchorionic hemorrhage:  None visualized.

Maternal uterus/adnexae: No pelvic free fluid. The right ovary
appears normal measuring 3.9 x 2.0 x 1.9 cm. The left ovary appears
symmetric and normal measuring 4.0 x 2.3 x 2.1 cm.
IMPRESSION: Single living IUP demonstrated with estimated gestational age of 6
weeks and 0 days by crown-rump length. No acute maternal findings
visualized.
